# Patient Record
Sex: Female | Born: 1960
Health system: Southern US, Community
[De-identification: ages and names within clinical notes are randomized; demographics above are authoritative.]

## PROBLEM LIST (undated history)

## (undated) DIAGNOSIS — R112 Nausea with vomiting, unspecified: Secondary | ICD-10-CM

## (undated) DIAGNOSIS — T884XXA Failed or difficult intubation, initial encounter: Secondary | ICD-10-CM

## (undated) DIAGNOSIS — F419 Anxiety disorder, unspecified: Secondary | ICD-10-CM

## (undated) DIAGNOSIS — M545 Low back pain, unspecified: Secondary | ICD-10-CM

## (undated) DIAGNOSIS — M26609 Unspecified temporomandibular joint disorder, unspecified side: Secondary | ICD-10-CM

## (undated) DIAGNOSIS — G43909 Migraine, unspecified, not intractable, without status migrainosus: Secondary | ICD-10-CM

## (undated) DIAGNOSIS — I10 Essential (primary) hypertension: Secondary | ICD-10-CM

## (undated) DIAGNOSIS — Z9889 Other specified postprocedural states: Secondary | ICD-10-CM

## (undated) DIAGNOSIS — R252 Cramp and spasm: Secondary | ICD-10-CM

## (undated) DIAGNOSIS — K219 Gastro-esophageal reflux disease without esophagitis: Secondary | ICD-10-CM

## (undated) DIAGNOSIS — G8929 Other chronic pain: Secondary | ICD-10-CM

## (undated) DIAGNOSIS — E785 Hyperlipidemia, unspecified: Secondary | ICD-10-CM

## (undated) DIAGNOSIS — K449 Diaphragmatic hernia without obstruction or gangrene: Secondary | ICD-10-CM

## (undated) DIAGNOSIS — R253 Fasciculation: Principal | ICD-10-CM

## (undated) HISTORY — DX: Low back pain, unspecified: M54.50

## (undated) HISTORY — PX: CHOLECYSTECTOMY: SHX55

## (undated) HISTORY — DX: Migraine, unspecified, not intractable, without status migrainosus: G43.909

## (undated) HISTORY — PX: TONSILLECTOMY: SUR1361

## (undated) HISTORY — DX: Fasciculation: R25.3

## (undated) HISTORY — DX: Anxiety disorder, unspecified: F41.9

## (undated) HISTORY — PX: LAPAROSCOPIC HYSTERECTOMY: SHX1926

## (undated) HISTORY — DX: Diaphragmatic hernia without obstruction or gangrene: K44.9

## (undated) HISTORY — DX: Gastro-esophageal reflux disease without esophagitis: K21.9

## (undated) HISTORY — DX: Hyperlipidemia, unspecified: E78.5

## (undated) HISTORY — DX: Cramp and spasm: R25.2

## (undated) HISTORY — DX: Other chronic pain: G89.29

## (undated) HISTORY — DX: Low back pain: M54.5

---

## 1998-04-24 ENCOUNTER — Other Ambulatory Visit: Admission: RE | Admit: 1998-04-24 | Discharge: 1998-04-24 | Payer: Self-pay | Admitting: Obstetrics and Gynecology

## 2000-05-18 ENCOUNTER — Other Ambulatory Visit: Admission: RE | Admit: 2000-05-18 | Discharge: 2000-05-18 | Payer: Self-pay | Admitting: Obstetrics and Gynecology

## 2000-11-17 ENCOUNTER — Encounter: Admission: RE | Admit: 2000-11-17 | Discharge: 2000-11-17 | Payer: Self-pay | Admitting: Family Medicine

## 2000-11-17 ENCOUNTER — Encounter: Payer: Self-pay | Admitting: Family Medicine

## 2001-05-19 ENCOUNTER — Other Ambulatory Visit: Admission: RE | Admit: 2001-05-19 | Discharge: 2001-05-19 | Payer: Self-pay | Admitting: Obstetrics and Gynecology

## 2004-08-02 ENCOUNTER — Encounter: Admission: RE | Admit: 2004-08-02 | Discharge: 2004-08-02 | Payer: Self-pay | Admitting: Family Medicine

## 2004-08-10 ENCOUNTER — Encounter: Admission: RE | Admit: 2004-08-10 | Discharge: 2004-08-10 | Payer: Self-pay | Admitting: Family Medicine

## 2004-11-21 ENCOUNTER — Encounter: Admission: RE | Admit: 2004-11-21 | Discharge: 2004-11-21 | Payer: Self-pay | Admitting: Family Medicine

## 2007-11-03 ENCOUNTER — Other Ambulatory Visit: Admission: RE | Admit: 2007-11-03 | Discharge: 2007-11-03 | Payer: Self-pay | Admitting: Family Medicine

## 2008-04-18 ENCOUNTER — Emergency Department (HOSPITAL_BASED_OUTPATIENT_CLINIC_OR_DEPARTMENT_OTHER): Admission: EM | Admit: 2008-04-18 | Discharge: 2008-04-18 | Payer: Self-pay | Admitting: Emergency Medicine

## 2008-05-03 ENCOUNTER — Emergency Department (HOSPITAL_BASED_OUTPATIENT_CLINIC_OR_DEPARTMENT_OTHER): Admission: EM | Admit: 2008-05-03 | Discharge: 2008-05-03 | Payer: Self-pay | Admitting: Emergency Medicine

## 2009-02-28 ENCOUNTER — Ambulatory Visit: Payer: Self-pay | Admitting: Vascular Surgery

## 2009-02-28 ENCOUNTER — Observation Stay (HOSPITAL_COMMUNITY): Admission: EM | Admit: 2009-02-28 | Discharge: 2009-03-01 | Payer: Self-pay | Admitting: Emergency Medicine

## 2009-02-28 ENCOUNTER — Encounter (INDEPENDENT_AMBULATORY_CARE_PROVIDER_SITE_OTHER): Payer: Self-pay | Admitting: Emergency Medicine

## 2010-03-28 LAB — TROPONIN I: Troponin I: 0.01 ng/mL (ref 0.00–0.06)

## 2010-03-28 LAB — HEMOGLOBIN A1C
Hgb A1c MFr Bld: 5.8 % (ref 4.6–6.1)
Mean Plasma Glucose: 120 mg/dL

## 2010-03-28 LAB — URINALYSIS, MICROSCOPIC ONLY
Glucose, UA: NEGATIVE mg/dL
Hgb urine dipstick: NEGATIVE
Ketones, ur: NEGATIVE mg/dL
Specific Gravity, Urine: 1.027 (ref 1.005–1.030)
Urobilinogen, UA: 0.2 mg/dL (ref 0.0–1.0)
pH: 6 (ref 5.0–8.0)

## 2010-03-28 LAB — CARDIAC PANEL(CRET KIN+CKTOT+MB+TROPI)
CK, MB: 3.1 ng/mL (ref 0.3–4.0)
Relative Index: 1.7 (ref 0.0–2.5)
Relative Index: 1.7 (ref 0.0–2.5)
Total CK: 179 U/L — ABNORMAL HIGH (ref 7–177)
Total CK: 193 U/L — ABNORMAL HIGH (ref 7–177)
Troponin I: 0.01 ng/mL (ref 0.00–0.06)
Troponin I: 0.06 ng/mL (ref 0.00–0.06)

## 2010-03-28 LAB — COMPREHENSIVE METABOLIC PANEL
ALT: 30 U/L (ref 0–35)
AST: 27 U/L (ref 0–37)
Albumin: 4.2 g/dL (ref 3.5–5.2)
Alkaline Phosphatase: 92 U/L (ref 39–117)
CO2: 24 mEq/L (ref 19–32)
Creatinine, Ser: 0.75 mg/dL (ref 0.4–1.2)
Potassium: 4 mEq/L (ref 3.5–5.1)
Sodium: 139 mEq/L (ref 135–145)
Total Protein: 6.7 g/dL (ref 6.0–8.3)

## 2010-03-28 LAB — CBC
HCT: 42.1 % (ref 36.0–46.0)
Hemoglobin: 14.6 g/dL (ref 12.0–15.0)
MCV: 90.5 fL (ref 78.0–100.0)
WBC: 10 10*3/uL (ref 4.0–10.5)

## 2010-03-28 LAB — DIFFERENTIAL
Eosinophils Absolute: 0.2 10*3/uL (ref 0.0–0.7)
Eosinophils Relative: 2 % (ref 0–5)
Lymphs Abs: 2.7 10*3/uL (ref 0.7–4.0)
Monocytes Absolute: 0.8 10*3/uL (ref 0.1–1.0)

## 2010-03-28 LAB — LIPID PANEL
LDL Cholesterol: 84 mg/dL (ref 0–99)
Triglycerides: 226 mg/dL — ABNORMAL HIGH (ref <150)
VLDL: 45 mg/dL — ABNORMAL HIGH (ref 0–40)

## 2010-03-28 LAB — GLUCOSE, CAPILLARY
Glucose-Capillary: 104 mg/dL — ABNORMAL HIGH (ref 70–99)
Glucose-Capillary: 105 mg/dL — ABNORMAL HIGH (ref 70–99)

## 2010-03-28 LAB — CK TOTAL AND CKMB (NOT AT ARMC): Relative Index: 1.7 (ref 0.0–2.5)

## 2011-06-03 ENCOUNTER — Other Ambulatory Visit: Payer: Self-pay | Admitting: Obstetrics & Gynecology

## 2011-06-03 DIAGNOSIS — R928 Other abnormal and inconclusive findings on diagnostic imaging of breast: Secondary | ICD-10-CM

## 2011-06-09 ENCOUNTER — Ambulatory Visit
Admission: RE | Admit: 2011-06-09 | Discharge: 2011-06-09 | Disposition: A | Discharge status: Home / Self Care | Payer: BC Managed Care – PPO | Source: Ambulatory Visit | Attending: Obstetrics & Gynecology | Admitting: Obstetrics & Gynecology

## 2011-06-09 DIAGNOSIS — R928 Other abnormal and inconclusive findings on diagnostic imaging of breast: Secondary | ICD-10-CM

## 2012-10-03 ENCOUNTER — Other Ambulatory Visit: Payer: Self-pay | Admitting: Physician Assistant

## 2012-10-03 DIAGNOSIS — M549 Dorsalgia, unspecified: Secondary | ICD-10-CM

## 2012-10-09 ENCOUNTER — Ambulatory Visit
Admission: RE | Admit: 2012-10-09 | Discharge: 2012-10-09 | Disposition: A | Discharge status: Home / Self Care | Payer: Self-pay | Source: Ambulatory Visit | Attending: Physician Assistant | Admitting: Physician Assistant

## 2012-10-09 DIAGNOSIS — M549 Dorsalgia, unspecified: Secondary | ICD-10-CM

## 2012-11-04 ENCOUNTER — Other Ambulatory Visit: Payer: Self-pay | Admitting: Neurosurgery

## 2012-11-04 DIAGNOSIS — M542 Cervicalgia: Secondary | ICD-10-CM

## 2012-11-04 DIAGNOSIS — M545 Low back pain: Secondary | ICD-10-CM

## 2012-11-04 DIAGNOSIS — M5416 Radiculopathy, lumbar region: Secondary | ICD-10-CM | POA: Insufficient documentation

## 2012-11-04 DIAGNOSIS — M5030 Other cervical disc degeneration, unspecified cervical region: Secondary | ICD-10-CM | POA: Insufficient documentation

## 2012-11-12 ENCOUNTER — Other Ambulatory Visit: Payer: Self-pay

## 2013-03-06 ENCOUNTER — Ambulatory Visit
Admission: RE | Admit: 2013-03-06 | Discharge: 2013-03-06 | Disposition: A | Discharge status: Home / Self Care | Payer: No Typology Code available for payment source | Source: Ambulatory Visit | Attending: Physician Assistant | Admitting: Physician Assistant

## 2013-03-06 ENCOUNTER — Other Ambulatory Visit: Payer: Self-pay | Admitting: Physician Assistant

## 2013-03-06 DIAGNOSIS — R52 Pain, unspecified: Secondary | ICD-10-CM

## 2013-03-15 ENCOUNTER — Ambulatory Visit (INDEPENDENT_AMBULATORY_CARE_PROVIDER_SITE_OTHER): Payer: Self-pay

## 2013-03-15 ENCOUNTER — Encounter (INDEPENDENT_AMBULATORY_CARE_PROVIDER_SITE_OTHER): Payer: Self-pay

## 2013-03-15 ENCOUNTER — Ambulatory Visit (INDEPENDENT_AMBULATORY_CARE_PROVIDER_SITE_OTHER): Payer: No Typology Code available for payment source | Admitting: Neurology

## 2013-03-15 DIAGNOSIS — Z0289 Encounter for other administrative examinations: Secondary | ICD-10-CM

## 2013-03-15 DIAGNOSIS — M79609 Pain in unspecified limb: Secondary | ICD-10-CM

## 2013-03-15 DIAGNOSIS — R209 Unspecified disturbances of skin sensation: Secondary | ICD-10-CM

## 2013-03-15 DIAGNOSIS — IMO0001 Reserved for inherently not codable concepts without codable children: Secondary | ICD-10-CM

## 2013-03-15 NOTE — Procedures (Signed)
     HISTORY:  Diana Browning is a 53 year old patient with a least a three-year history of diffuse neuromuscular pain involving the neck, shoulders, back, legs, and arms. The patient has particular discomfort in the neck and shoulders and left arm, with associated stiffness of the neck. The patient is being evaluated for these neuromuscular symptoms.  NERVE CONDUCTION STUDIES:  Nerve conduction studies were performed on both upper extremities. The distal motor latencies and motor amplitudes for the median and ulnar nerves were within normal limits. The F wave latencies and nerve conduction velocities for these nerves were also normal. The sensory latencies for the median and ulnar nerves were normal.   EMG STUDIES:  EMG study was performed on the right upper extremity:  The first dorsal interosseous muscle reveals 2 to 4 K units with full recruitment. No fibrillations or positive waves were noted. The abductor pollicis brevis muscle reveals 2 to 4 K units with full recruitment. No fibrillations or positive waves were noted. The extensor indicis proprius muscle reveals 1 to 3 K units with full recruitment. No fibrillations or positive waves were noted. The pronator teres muscle reveals 2 to 3 K units with full recruitment. No fibrillations or positive waves were noted. The biceps muscle reveals 1 to 2 K units with full recruitment. No fibrillations or positive waves were noted. The triceps muscle reveals 2 to 4 K units with full recruitment. No fibrillations or positive waves were noted. The anterior deltoid muscle reveals 2 to 3 K units with full recruitment. No fibrillations or positive waves were noted. The cervical paraspinal muscles were tested at 2 levels. No abnormalities of insertional activity were seen at either level tested. There was good relaxation.  EMG study was performed on the left upper extremity:  The first dorsal interosseous muscle reveals 2 to 4 K units with full  recruitment. No fibrillations or positive waves were noted. The abductor pollicis brevis muscle reveals 2 to 4 K units with full recruitment. No fibrillations or positive waves were noted. The extensor indicis proprius muscle reveals 1 to 3 K units with full recruitment. No fibrillations or positive waves were noted. The pronator teres muscle reveals 2 to 3 K units with full recruitment. No fibrillations or positive waves were noted. The biceps muscle reveals 1 to 2 K units with full recruitment. No fibrillations or positive waves were noted. The triceps muscle reveals 2 to 4 K units with full recruitment. No fibrillations or positive waves were noted. The anterior deltoid muscle reveals 2 to 3 K units with full recruitment. No fibrillations or positive waves were noted. The cervical paraspinal muscles were tested at 2 levels. No abnormalities of insertional activity were seen at either level tested. There was good relaxation.   IMPRESSION:  Nerve conduction studies done on both upper extremities were within normal limits. No evidence of a neuropathy is seen. EMG evaluation of both upper extremities is unremarkable, without evidence of an overlying cervical radiculopathy on either side.  Jill Alexanders MD 03/15/2013 10:49 AM  [facility] [address] [place], [place] [address]  Phone [phone] Fax [phone]

## 2013-03-27 ENCOUNTER — Ambulatory Visit: Payer: No Typology Code available for payment source | Attending: Orthopedic Surgery

## 2013-03-27 DIAGNOSIS — R293 Abnormal posture: Secondary | ICD-10-CM | POA: Insufficient documentation

## 2013-03-27 DIAGNOSIS — R5381 Other malaise: Secondary | ICD-10-CM | POA: Insufficient documentation

## 2013-03-27 DIAGNOSIS — M542 Cervicalgia: Secondary | ICD-10-CM | POA: Insufficient documentation

## 2013-03-27 DIAGNOSIS — IMO0001 Reserved for inherently not codable concepts without codable children: Secondary | ICD-10-CM | POA: Insufficient documentation

## 2013-03-30 ENCOUNTER — Ambulatory Visit: Payer: No Typology Code available for payment source | Admitting: Physical Therapy

## 2013-04-04 ENCOUNTER — Encounter: Payer: No Typology Code available for payment source | Admitting: Physical Therapy

## 2013-04-05 ENCOUNTER — Telehealth: Payer: Self-pay | Admitting: *Deleted

## 2013-04-05 NOTE — Telephone Encounter (Signed)
Pt called to cancel her appts for 04/06/13 due to muscle spasm. She stated she needed one more day off...td

## 2013-04-06 ENCOUNTER — Encounter: Payer: No Typology Code available for payment source | Admitting: Physical Therapy

## 2013-04-10 ENCOUNTER — Ambulatory Visit: Payer: No Typology Code available for payment source | Attending: Orthopedic Surgery

## 2013-04-10 DIAGNOSIS — M542 Cervicalgia: Secondary | ICD-10-CM | POA: Insufficient documentation

## 2013-04-10 DIAGNOSIS — IMO0001 Reserved for inherently not codable concepts without codable children: Secondary | ICD-10-CM | POA: Insufficient documentation

## 2013-04-10 DIAGNOSIS — R5381 Other malaise: Secondary | ICD-10-CM | POA: Insufficient documentation

## 2013-04-10 DIAGNOSIS — R293 Abnormal posture: Secondary | ICD-10-CM | POA: Insufficient documentation

## 2013-04-12 ENCOUNTER — Ambulatory Visit: Payer: No Typology Code available for payment source | Admitting: Physical Therapy

## 2013-04-19 ENCOUNTER — Encounter: Payer: Self-pay | Admitting: Neurology

## 2013-04-20 ENCOUNTER — Encounter (INDEPENDENT_AMBULATORY_CARE_PROVIDER_SITE_OTHER): Payer: Self-pay

## 2013-04-20 ENCOUNTER — Encounter: Payer: Self-pay | Admitting: Neurology

## 2013-04-20 ENCOUNTER — Ambulatory Visit (INDEPENDENT_AMBULATORY_CARE_PROVIDER_SITE_OTHER): Payer: No Typology Code available for payment source | Admitting: Neurology

## 2013-04-20 VITALS — BP 146/90 | HR 83 | Ht 66.0 in | Wt 166.0 lb

## 2013-04-20 DIAGNOSIS — R259 Unspecified abnormal involuntary movements: Secondary | ICD-10-CM

## 2013-04-20 DIAGNOSIS — R252 Cramp and spasm: Secondary | ICD-10-CM

## 2013-04-20 DIAGNOSIS — R253 Fasciculation: Secondary | ICD-10-CM

## 2013-04-20 HISTORY — DX: Cramp and spasm: R25.2

## 2013-04-20 HISTORY — DX: Fasciculation: R25.3

## 2013-04-20 MED ORDER — GABAPENTIN 300 MG PO CAPS: ORAL_CAPSULE | ORAL | Status: DC

## 2013-04-20 NOTE — Patient Instructions (Signed)
Leg Cramps  Leg cramps that occur during exercise can be caused by poor circulation or dehydration. However, muscle cramps that occur at rest or during the night are usually not due to any serious medical problem. Heat cramps may cause muscle spasms during hot weather.   CAUSES  There is no clear cause for muscle cramps. However, dehydration may be a factor for those who do not drink enough fluids and those who exercise in the heat. Imbalances in the level of sodium, potassium, calcium or magnesium in the muscle tissue may also be a factor. Some medications, such as water pills (diuretics), may cause loss of chemicals that the body needs (like sodium and potassium) and cause muscle cramps.  TREATMENT   · Make sure your diet has enough fluids and essential minerals for the muscle to work normally.  · Avoid strenuous exercise for several days if you have been having frequent leg cramps.  · Stretch and massage the cramped muscle for several minutes.  · Some medicines may be helpful in some patients with night cramps. Only take over-the-counter or prescription medicines as directed by your caregiver.  SEEK IMMEDIATE MEDICAL CARE IF:   · Your leg cramps become worse.  · Your foot becomes cold, numb, or blue.  Document Released: 01/30/2004 Document Revised: 03/16/2011 Document Reviewed: 01/17/2008  Exit[employer]® Patient Information ©2014 [employer].

## 2013-04-20 NOTE — Progress Notes (Signed)
Reason for visit: Fasciculations  Diana Browning is a 53 y.o. female  History of present illness:  Diana Browning is a 53 year old left-handed white female with a history of fasciculations in the muscles that began on the left arm and left leg 5 years ago. Eventually, the same issues spread to the right arm and leg. The patient has chronic low back pain in part associated from multiple injuries that were occurred while she was actively riding horses. The patient also has some neck discomfort. The patient reports no weakness of the extremities or problems with control of the bowels or the bladder. The patient denies any balance issues. The patient has had no other issues such as visual changes, speech or swallowing problems. The patient indicates that she also has muscle cramps that involves the arms and legs as well, more likely to occur in the legs. When she was on statin medications for cholesterol, the cramps were quite significant, but she has come off of this medication. The patient was recently placed on gabapentin, currently on 200 mg 3 times daily with some good benefit with the cramps and the fasciculations. The patient has had EMG and nerve conduction studies done through this office that were unremarkable, without evidence of a peripheral neuropathy or evidence of a radiculopathy or a myopathic disorder. The patient does report some intermittent numbness of the hands and forearms, occasionally with the feet. The numbness is not persistent.  Past Medical History  Diagnosis Date  . Migraine   . Chronic low back pain   . Dyslipidemia   . GERD (gastroesophageal reflux disease)   . Hiatal hernia   . Anxiety   . Fasciculations of muscle 04/20/2013  . Muscle cramping 04/20/2013    Past Surgical History  Procedure Laterality Date  . Laparoscopic hysterectomy    . Tonsillectomy    . Cholecystectomy      Family History  Problem Relation Age of Onset  . Stroke Father   . COPD Sister      Social history:  reports that she has quit smoking. She has never used smokeless tobacco. She reports that she does not drink alcohol or use illicit drugs.  Medications:  Current Outpatient Prescriptions on File Prior to Visit  Medication Sig Dispense Refill  . ALPRAZolam (XANAX) 0.5 MG tablet Take 0.5 mg by mouth at bedtime.       Marland Kitchen estradiol (ESTRACE) 2 MG tablet Take 2 mg by mouth 2 (two) times daily.       Marland Kitchen HYDROcodone-acetaminophen (NORCO/VICODIN) 5-325 MG per tablet Take 1 tablet by mouth daily.      . methocarbamol (ROBAXIN) 750 MG tablet Take 750 mg by mouth daily as needed.       No current facility-administered medications on file prior to visit.      Allergies  Allergen Reactions  . Penicillins     Unknown reaction    ROS:  Out of a complete 14 system review of symptoms, the patient complains only of the following symptoms, and all other reviewed systems are negative.  Excessive sweating Neck pain, neck stiffness Insomnia Joint pain, back pain, achy muscles, muscle cramps Numbness Decreased concentration, depression  Blood pressure 146/90, pulse 83, height 5\' 6"  (1.676 m), weight 166 lb (75.297 kg).  Physical Exam  General: The patient is alert and cooperative at the time of the examination.  Eyes: Pupils are equal, round, and reactive to light. Discs are flat bilaterally.  Neck: The neck is supple,  no carotid bruits are noted.  Respiratory: The respiratory examination is clear.  Cardiovascular: The cardiovascular examination reveals a regular rate and rhythm, no obvious murmurs or rubs are noted.  Neuromuscular: Range of movement of the cervical spine lacks about 10 of full lateral rotation. The patient has good range of movement of the low back.  Skin: Extremities are without significant edema.  Neurologic Exam  Mental status: The patient is alert and oriented x 3 at the time of the examination. The patient has apparent normal recent and remote  memory, with an apparently normal attention span and concentration ability.  Cranial nerves: Facial symmetry is present. There is good sensation of the face to pinprick and soft touch bilaterally. The strength of the facial muscles and the muscles to head turning and shoulder shrug are normal bilaterally. Speech is well enunciated, no aphasia or dysarthria is noted. Extraocular movements are full. Visual fields are full. The tongue is midline, and the patient has symmetric elevation of the soft palate. No obvious hearing deficits are noted.  Motor: The motor testing reveals 5 over 5 strength of all 4 extremities. Good symmetric motor tone is noted throughout. No fasciculations were observed in the muscles. No muscular atrophy is seen.  Sensory: Sensory testing is intact to pinprick, soft touch, vibration sensation, and position sense on all 4 extremities. No evidence of extinction is noted.  Coordination: Cerebellar testing reveals good finger-nose-finger and heel-to-shin bilaterally.  Gait and station: Gait is normal. Tandem gait is normal. Romberg is negative. No drift is seen.  Reflexes: Deep tendon reflexes are symmetric and normal bilaterally, with the exception that the ankle jerk reflexes are depressed bilaterally. Toes are downgoing bilaterally.   Assessment/Plan:  1. Probable fasciculation syndrome  The patient has no observable fasciculations, but she reports muscle twitches throughout the body, with intermittent cramping of the muscles. The patient has gained some benefit from gabapentin. Dilantin may also offer benefit. The patient will be increased on the gabapentin taking 300 mg 3 times daily for one or 2 weeks, and then go to 300 mg twice during the day, 600 mg in the evening. The patient will followup in 6 months.  Jill Alexanders MD 04/20/2013 9:18 PM  [facility] [address] [place], [place] [address]  Phone [phone] Fax  [phone]

## 2013-06-28 ENCOUNTER — Other Ambulatory Visit: Payer: Self-pay | Admitting: Orthopedic Surgery

## 2013-06-28 DIAGNOSIS — M25511 Pain in right shoulder: Secondary | ICD-10-CM

## 2013-07-05 ENCOUNTER — Ambulatory Visit
Admission: RE | Admit: 2013-07-05 | Discharge: 2013-07-05 | Disposition: A | Discharge status: Home / Self Care | Payer: No Typology Code available for payment source | Source: Ambulatory Visit | Attending: Orthopedic Surgery | Admitting: Orthopedic Surgery

## 2013-07-05 DIAGNOSIS — M25511 Pain in right shoulder: Secondary | ICD-10-CM

## 2014-07-03 ENCOUNTER — Encounter (HOSPITAL_COMMUNITY): Payer: Self-pay | Admitting: *Deleted

## 2014-07-03 ENCOUNTER — Emergency Department (HOSPITAL_COMMUNITY): Payer: 59

## 2014-07-03 ENCOUNTER — Emergency Department (HOSPITAL_COMMUNITY)
Admission: EM | Admit: 2014-07-03 | Discharge: 2014-07-03 | Disposition: A | Discharge status: Home / Self Care | Payer: 59 | Attending: Emergency Medicine | Admitting: Emergency Medicine

## 2014-07-03 DIAGNOSIS — F419 Anxiety disorder, unspecified: Secondary | ICD-10-CM | POA: Diagnosis not present

## 2014-07-03 DIAGNOSIS — Z79899 Other long term (current) drug therapy: Secondary | ICD-10-CM | POA: Diagnosis not present

## 2014-07-03 DIAGNOSIS — G43909 Migraine, unspecified, not intractable, without status migrainosus: Secondary | ICD-10-CM | POA: Diagnosis not present

## 2014-07-03 DIAGNOSIS — Z87891 Personal history of nicotine dependence: Secondary | ICD-10-CM | POA: Diagnosis not present

## 2014-07-03 DIAGNOSIS — G8929 Other chronic pain: Secondary | ICD-10-CM | POA: Diagnosis not present

## 2014-07-03 DIAGNOSIS — R52 Pain, unspecified: Secondary | ICD-10-CM

## 2014-07-03 DIAGNOSIS — Z8639 Personal history of other endocrine, nutritional and metabolic disease: Secondary | ICD-10-CM | POA: Insufficient documentation

## 2014-07-03 DIAGNOSIS — Z8719 Personal history of other diseases of the digestive system: Secondary | ICD-10-CM | POA: Insufficient documentation

## 2014-07-03 DIAGNOSIS — R51 Headache: Secondary | ICD-10-CM | POA: Diagnosis present

## 2014-07-03 DIAGNOSIS — Z880 Allergy status to penicillin: Secondary | ICD-10-CM | POA: Insufficient documentation

## 2014-07-03 DIAGNOSIS — R519 Headache, unspecified: Secondary | ICD-10-CM

## 2014-07-03 LAB — CBC WITH DIFFERENTIAL/PLATELET
BASOS PCT: 1 % (ref 0–1)
Basophils Absolute: 0 10*3/uL (ref 0.0–0.1)
EOS ABS: 0.1 10*3/uL (ref 0.0–0.7)
EOS PCT: 2 % (ref 0–5)
HCT: 37.4 % (ref 36.0–46.0)
Hemoglobin: 12.9 g/dL (ref 12.0–15.0)
LYMPHS ABS: 2.5 10*3/uL (ref 0.7–4.0)
Lymphocytes Relative: 32 % (ref 12–46)
MCH: 30.6 pg (ref 26.0–34.0)
MCHC: 34.5 g/dL (ref 30.0–36.0)
MCV: 88.8 fL (ref 78.0–100.0)
Monocytes Absolute: 0.5 10*3/uL (ref 0.1–1.0)
Monocytes Relative: 7 % (ref 3–12)
NEUTROS PCT: 58 % (ref 43–77)
Neutro Abs: 4.6 10*3/uL (ref 1.7–7.7)
PLATELETS: 261 10*3/uL (ref 150–400)
RBC: 4.21 MIL/uL (ref 3.87–5.11)
RDW: 13 % (ref 11.5–15.5)
WBC: 7.7 10*3/uL (ref 4.0–10.5)

## 2014-07-03 LAB — COMPREHENSIVE METABOLIC PANEL
ALBUMIN: 3.4 g/dL — AB (ref 3.5–5.0)
ALT: 16 U/L (ref 14–54)
AST: 18 U/L (ref 15–41)
Alkaline Phosphatase: 50 U/L (ref 38–126)
Anion gap: 5 (ref 5–15)
BILIRUBIN TOTAL: 0.3 mg/dL (ref 0.3–1.2)
BUN: 11 mg/dL (ref 6–20)
CO2: 26 mmol/L (ref 22–32)
CREATININE: 0.79 mg/dL (ref 0.44–1.00)
Calcium: 8.1 mg/dL — ABNORMAL LOW (ref 8.9–10.3)
Chloride: 109 mmol/L (ref 101–111)
GFR calc Af Amer: >60 mL/min (ref >60)
GFR calc non Af Amer: >60 mL/min (ref >60)
Glucose, Bld: 135 mg/dL — ABNORMAL HIGH (ref 65–99)
POTASSIUM: 3.5 mmol/L (ref 3.5–5.1)
Sodium: 140 mmol/L (ref 135–145)
TOTAL PROTEIN: 6 g/dL — AB (ref 6.5–8.1)

## 2014-07-03 MED ORDER — DIPHENHYDRAMINE HCL 50 MG/ML IJ SOLN
25.0000 mg | Freq: Once | INTRAMUSCULAR | Status: AC
  Administered 2014-07-03: 25 mg via INTRAVENOUS
  Filled 2014-07-03: qty 1

## 2014-07-03 MED ORDER — OXYCODONE-ACETAMINOPHEN 5-325 MG PO TABS: 1.0000 | ORAL_TABLET | Freq: Four times a day (QID) | ORAL | Status: DC | PRN

## 2014-07-03 MED ORDER — OXYCODONE-ACETAMINOPHEN 5-325 MG PO TABS
ORAL_TABLET | ORAL | Status: AC
  Filled 2014-07-03: qty 1

## 2014-07-03 MED ORDER — METOCLOPRAMIDE HCL 5 MG/ML IJ SOLN
10.0000 mg | Freq: Once | INTRAMUSCULAR | Status: AC
  Administered 2014-07-03: 10 mg via INTRAVENOUS
  Filled 2014-07-03: qty 2

## 2014-07-03 MED ORDER — KETOROLAC TROMETHAMINE 30 MG/ML IJ SOLN
30.0000 mg | Freq: Once | INTRAMUSCULAR | Status: AC
  Administered 2014-07-03: 30 mg via INTRAVENOUS
  Filled 2014-07-03: qty 1

## 2014-07-03 MED ORDER — TRAMADOL HCL 50 MG PO TABS: 50.0000 mg | ORAL_TABLET | Freq: Four times a day (QID) | ORAL | Status: DC | PRN

## 2014-07-03 MED ORDER — OXYCODONE-ACETAMINOPHEN 5-325 MG PO TABS
1.0000 | ORAL_TABLET | Freq: Once | ORAL | Status: AC
  Administered 2014-07-03: 1 via ORAL

## 2014-07-03 MED ORDER — SODIUM CHLORIDE 0.9 % IV BOLUS (SEPSIS)
500.0000 mL | Freq: Once | INTRAVENOUS | Status: AC
  Administered 2014-07-03: 500 mL via INTRAVENOUS

## 2014-07-03 NOTE — Discharge Instructions (Signed)
Follow up with your md next week for recheck °

## 2014-07-03 NOTE — ED Notes (Signed)
Pts family member given something to eat.

## 2014-07-03 NOTE — ED Notes (Signed)
Pt given sandwich and crackers.  

## 2014-07-03 NOTE — ED Notes (Signed)
Pt reports sudden onset right side headache starting around 1130 today. Pt reports seeing a prism. Pt went to [facility] on [address]. Pt was found to be hypertensive, pt given amlodipine x 2 then instructed to come to ED for further evaluation. Pt was not given any pain medication, pt reports a decrease in headache. Pt reports taking an aspirin around 1200 at work.

## 2014-07-06 NOTE — ED Provider Notes (Signed)
CSN: [id number]     Arrival date & time 07/03/14  1451 History   First MD Initiated Contact with Patient 07/03/14 1723     Chief Complaint  Patient presents with  . Headache     (Consider location/radiation/quality/duration/timing/severity/associated sxs/prior Treatment) Patient is a 54 y.o. female presenting with headaches. The history is provided by the patient (pt comlains of a headache and some flashing lights.  this is worse then her nl headache).  Headache Pain location:  Generalized Quality:  Dull Radiates to:  Eyes Severity currently:  6/10 Severity at highest:  8/10 Onset quality:  Sudden Associated symptoms: no abdominal pain, no back pain, no congestion, no cough, no diarrhea, no fatigue, no seizures and no sinus pressure     Past Medical History  Diagnosis Date  . Migraine   . Chronic low back pain   . Dyslipidemia   . GERD (gastroesophageal reflux disease)   . Hiatal hernia   . Anxiety   . Fasciculations of muscle 04/20/2013  . Muscle cramping 04/20/2013   Past Surgical History  Procedure Laterality Date  . Laparoscopic hysterectomy    . Tonsillectomy    . Cholecystectomy     Family History  Problem Relation Age of Onset  . Stroke Father   . COPD Sister    History  Substance Use Topics  . Smoking status: Former Research scientist (life sciences)  . Smokeless tobacco: Never Used  . Alcohol Use: No     Comment: Quit 2011   OB History    No data available     Review of Systems  Constitutional: Negative for appetite change and fatigue.  HENT: Negative for congestion, ear discharge and sinus pressure.   Eyes: Negative for discharge.  Respiratory: Negative for cough.   Cardiovascular: Negative for chest pain.  Gastrointestinal: Negative for abdominal pain and diarrhea.  Genitourinary: Negative for frequency and hematuria.  Musculoskeletal: Negative for back pain.  Skin: Negative for rash.  Neurological: Positive for headaches. Negative for seizures.  Psychiatric/Behavioral:  Negative for hallucinations.      Allergies  Penicillins  Home Medications   Prior to Admission medications   Medication Sig Start Date End Date Taking? Authorizing Provider  acetaminophen (TYLENOL) 500 MG tablet Take 500 mg by mouth every 6 (six) hours as needed.    Historical Provider, MD  ALPRAZolam Duanne Moron) 0.5 MG tablet Take 0.5 mg by mouth at bedtime.  02/14/13   Historical Provider, MD  estradiol (ESTRACE) 2 MG tablet Take 2 mg by mouth 2 (two) times daily.  03/13/13   Historical Provider, MD  gabapentin (NEURONTIN) 300 MG capsule One capsule in the morning and at midday, two capsules in the evening 04/20/13   Kathrynn Ducking, MD  HYDROcodone-acetaminophen (NORCO/VICODIN) 5-325 MG per tablet Take 1 tablet by mouth daily. 03/01/13   Historical Provider, MD  Magnesium Oxide 500 MG CAPS Take 500 mg by mouth daily.    Historical Provider, MD  methocarbamol (ROBAXIN) 750 MG tablet Take 750 mg by mouth daily as needed. 03/08/13   Historical Provider, MD  oxyCODONE-acetaminophen (PERCOCET) 5-325 MG per tablet Take 1 tablet by mouth every 6 (six) hours as needed. 07/03/14   Milton Ferguson, MD  traMADol (ULTRAM) 50 MG tablet Take 1 tablet (50 mg total) by mouth every 6 (six) hours as needed. 07/03/14   Milton Ferguson, MD   BP 142/75 mmHg  Pulse 60  Temp(Src) 97.9 F (36.6 C) (Oral)  Resp 15  SpO2 99% Physical Exam  Constitutional: She is  oriented to person, place, and time. She appears well-developed.  HENT:  Head: Normocephalic.  Eyes: Conjunctivae and EOM are normal. No scleral icterus.  Neck: Neck supple. No thyromegaly present.  Cardiovascular: Normal rate and regular rhythm.  Exam reveals no gallop and no friction rub.   No murmur heard. Pulmonary/Chest: No stridor. She has no wheezes. She has no rales. She exhibits no tenderness.  Abdominal: She exhibits no distension. There is no tenderness. There is no rebound.  Musculoskeletal: Normal range of motion. She exhibits no edema.   Lymphadenopathy:    She has no cervical adenopathy.  Neurological: She is oriented to person, place, and time. She exhibits normal muscle tone. Coordination normal.  Skin: No rash noted. No erythema.  Psychiatric: She has a normal mood and affect. Her behavior is normal.    ED Course  Procedures (including critical care time) Labs Review Labs Reviewed  COMPREHENSIVE METABOLIC PANEL - Abnormal; Notable for the following:    Glucose, Bld 135 (*)    Calcium 8.1 (*)    Total Protein 6.0 (*)    Albumin 3.4 (*)    All other components within normal limits  CBC WITH DIFFERENTIAL/PLATELET    Imaging Review No results found.   EKG Interpretation None      MDM   Final diagnoses:  Pain  Headache around the eyes    Headache,  Pt improved with tx.  Nl ct head,  Nl labs,   Supple neck,   Headache consistent with migraine,  No meningismus signs.  She is improved with tx and will follow up with pcp    Milton Ferguson, MD 07/06/14 1429

## 2014-07-17 ENCOUNTER — Other Ambulatory Visit: Payer: Self-pay | Admitting: Orthopedic Surgery

## 2014-07-17 DIAGNOSIS — M542 Cervicalgia: Secondary | ICD-10-CM

## 2014-07-28 ENCOUNTER — Other Ambulatory Visit: Payer: No Typology Code available for payment source

## 2014-08-02 ENCOUNTER — Ambulatory Visit
Admission: RE | Admit: 2014-08-02 | Discharge: 2014-08-02 | Disposition: A | Discharge status: Home / Self Care | Payer: 59 | Source: Ambulatory Visit | Attending: Orthopedic Surgery | Admitting: Orthopedic Surgery

## 2014-08-02 DIAGNOSIS — M542 Cervicalgia: Secondary | ICD-10-CM

## 2014-10-17 ENCOUNTER — Other Ambulatory Visit: Payer: Self-pay | Admitting: Orthopedic Surgery

## 2014-10-17 DIAGNOSIS — M25561 Pain in right knee: Secondary | ICD-10-CM

## 2014-10-29 ENCOUNTER — Ambulatory Visit
Admission: RE | Admit: 2014-10-29 | Discharge: 2014-10-29 | Disposition: A | Discharge status: Home / Self Care | Payer: 59 | Source: Ambulatory Visit | Attending: Orthopedic Surgery | Admitting: Orthopedic Surgery

## 2014-10-29 DIAGNOSIS — M25561 Pain in right knee: Secondary | ICD-10-CM

## 2015-04-04 ENCOUNTER — Other Ambulatory Visit: Payer: Self-pay | Admitting: Gastroenterology

## 2015-04-04 DIAGNOSIS — R1310 Dysphagia, unspecified: Secondary | ICD-10-CM

## 2015-04-08 ENCOUNTER — Ambulatory Visit
Admission: RE | Admit: 2015-04-08 | Discharge: 2015-04-08 | Disposition: A | Discharge status: Home / Self Care | Payer: BLUE CROSS/BLUE SHIELD | Source: Ambulatory Visit | Attending: Gastroenterology | Admitting: Gastroenterology

## 2015-04-08 DIAGNOSIS — R1310 Dysphagia, unspecified: Secondary | ICD-10-CM

## 2015-07-26 ENCOUNTER — Other Ambulatory Visit: Payer: Self-pay | Admitting: Neurosurgery

## 2015-07-26 DIAGNOSIS — M5412 Radiculopathy, cervical region: Secondary | ICD-10-CM

## 2015-07-30 ENCOUNTER — Ambulatory Visit
Admission: RE | Admit: 2015-07-30 | Discharge: 2015-07-30 | Disposition: A | Discharge status: Home / Self Care | Payer: BLUE CROSS/BLUE SHIELD | Source: Ambulatory Visit | Attending: Neurosurgery | Admitting: Neurosurgery

## 2015-07-30 DIAGNOSIS — M5412 Radiculopathy, cervical region: Secondary | ICD-10-CM

## 2015-08-06 ENCOUNTER — Other Ambulatory Visit: Payer: BLUE CROSS/BLUE SHIELD

## 2015-09-12 ENCOUNTER — Other Ambulatory Visit: Payer: Self-pay | Admitting: Orthopedic Surgery

## 2015-09-12 DIAGNOSIS — M25512 Pain in left shoulder: Secondary | ICD-10-CM

## 2015-09-21 ENCOUNTER — Ambulatory Visit
Admission: RE | Admit: 2015-09-21 | Discharge: 2015-09-21 | Disposition: A | Discharge status: Home / Self Care | Payer: BLUE CROSS/BLUE SHIELD | Source: Ambulatory Visit | Attending: Orthopedic Surgery | Admitting: Orthopedic Surgery

## 2015-09-21 DIAGNOSIS — M25512 Pain in left shoulder: Secondary | ICD-10-CM

## 2015-10-31 ENCOUNTER — Ambulatory Visit: Payer: Self-pay | Admitting: Physician Assistant

## 2015-10-31 ENCOUNTER — Encounter (HOSPITAL_BASED_OUTPATIENT_CLINIC_OR_DEPARTMENT_OTHER): Payer: Self-pay | Admitting: *Deleted

## 2015-10-31 NOTE — H&P (Signed)
Diana Browning is an 55 y.o. female.   Chief Complaint: left shoulder rotator cuff tear  HPI: Diana Browning continues to struggle with severe left-sided shoulder pain that radiates up into the neck and then down the upper arm.  Injection back in August was only minimally helpful.  She went back to see Dr. Sherwood Gambler who did her neck surgery and he repeated cervical MRI according to the patient and she was told that everything was fine and there was no new nerve root impingement.  We know that she had rotator cuff disease on her right side and underwent arthroscopy and subacromial decompression which was helpful. Left shoulder MRI showed  Partial rotator cuff tear  Past Medical History:  Diagnosis Date  . Anxiety   . Chronic low back pain   . Difficult intubation    pt states that she has TMJ and it is difficult to open her mouth wide.  . Dyslipidemia   . Fasciculations of muscle 04/20/2013  . GERD (gastroesophageal reflux disease)   . Hiatal hernia   . Hypertension   . Migraine   . Muscle cramping 04/20/2013  . PONV (postoperative nausea and vomiting)   . TMJ dysfunction     Past Surgical History:  Procedure Laterality Date  . CHOLECYSTECTOMY    . LAPAROSCOPIC HYSTERECTOMY    . TONSILLECTOMY      Family History  Problem Relation Age of Onset  . Stroke Father   . COPD Sister    Social History:  reports that she has quit smoking. She has never used smokeless tobacco. She reports that she does not drink alcohol or use drugs.  Allergies:  Allergies  Allergen Reactions  . Penicillins     Unknown reaction     (Not in a hospital admission)  No results found for this or any previous visit (from the past 48 hour(s)). No results found.  Review of Systems  Musculoskeletal: Positive for joint pain.  Psychiatric/Behavioral: Positive for depression. The patient is nervous/anxious.   All other systems reviewed and are negative.   There were no vitals taken for this visit. Physical Exam   Constitutional: She is oriented to person, place, and time. She appears well-developed and well-nourished. No distress.  HENT:  Head: Normocephalic and atraumatic.  Nose: Nose normal.  Eyes: Conjunctivae and EOM are normal. Pupils are equal, round, and reactive to light.  Neck: Normal range of motion. Neck supple.  Cardiovascular: Normal rate and intact distal pulses.   Respiratory: Effort normal. No respiratory distress.  GI: Soft. She exhibits no distension. There is no tenderness.  Musculoskeletal:       Left shoulder: She exhibits decreased range of motion, tenderness, pain and decreased strength.  Neurological: She is alert and oriented to person, place, and time.  Skin: Skin is warm and dry. No rash noted. No erythema.  Psychiatric: She has a normal mood and affect. Her behavior is normal.     Assessment/Plan Left shoulder rotator cuff tear  Candidate for arthroscopy, exam under anesthesia, debridement.  Would lean against distal clavicle hopefully not needing a repair, but she does have an objective partial tear.  Risks and benefits discussed in detail.  Proceed on with scheduling for the left shoulder.  Chriss Czar, PA-C 10/31/2015, 2:05 PM

## 2015-11-04 ENCOUNTER — Other Ambulatory Visit: Payer: Self-pay

## 2015-11-04 ENCOUNTER — Encounter (HOSPITAL_BASED_OUTPATIENT_CLINIC_OR_DEPARTMENT_OTHER)
Admission: RE | Admit: 2015-11-04 | Discharge: 2015-11-04 | Disposition: A | Discharge status: Home / Self Care | Payer: BLUE CROSS/BLUE SHIELD | Source: Ambulatory Visit | Attending: Orthopedic Surgery | Admitting: Orthopedic Surgery

## 2015-11-04 DIAGNOSIS — Z825 Family history of asthma and other chronic lower respiratory diseases: Secondary | ICD-10-CM | POA: Diagnosis not present

## 2015-11-04 DIAGNOSIS — Z880 Allergy status to penicillin: Secondary | ICD-10-CM | POA: Diagnosis not present

## 2015-11-04 DIAGNOSIS — M19012 Primary osteoarthritis, left shoulder: Secondary | ICD-10-CM | POA: Diagnosis not present

## 2015-11-04 DIAGNOSIS — G8929 Other chronic pain: Secondary | ICD-10-CM | POA: Diagnosis not present

## 2015-11-04 DIAGNOSIS — I10 Essential (primary) hypertension: Secondary | ICD-10-CM | POA: Diagnosis not present

## 2015-11-04 DIAGNOSIS — M26609 Unspecified temporomandibular joint disorder, unspecified side: Secondary | ICD-10-CM | POA: Diagnosis not present

## 2015-11-04 DIAGNOSIS — M545 Low back pain: Secondary | ICD-10-CM | POA: Diagnosis not present

## 2015-11-04 DIAGNOSIS — M75112 Incomplete rotator cuff tear or rupture of left shoulder, not specified as traumatic: Secondary | ICD-10-CM | POA: Diagnosis present

## 2015-11-04 DIAGNOSIS — Z87891 Personal history of nicotine dependence: Secondary | ICD-10-CM | POA: Diagnosis not present

## 2015-11-04 DIAGNOSIS — K219 Gastro-esophageal reflux disease without esophagitis: Secondary | ICD-10-CM | POA: Diagnosis not present

## 2015-11-04 DIAGNOSIS — Z90710 Acquired absence of both cervix and uterus: Secondary | ICD-10-CM | POA: Diagnosis not present

## 2015-11-04 DIAGNOSIS — E785 Hyperlipidemia, unspecified: Secondary | ICD-10-CM | POA: Diagnosis not present

## 2015-11-04 DIAGNOSIS — K449 Diaphragmatic hernia without obstruction or gangrene: Secondary | ICD-10-CM | POA: Diagnosis not present

## 2015-11-04 DIAGNOSIS — Z79899 Other long term (current) drug therapy: Secondary | ICD-10-CM | POA: Diagnosis not present

## 2015-11-04 DIAGNOSIS — F419 Anxiety disorder, unspecified: Secondary | ICD-10-CM | POA: Diagnosis not present

## 2015-11-04 DIAGNOSIS — Z9889 Other specified postprocedural states: Secondary | ICD-10-CM | POA: Diagnosis not present

## 2015-11-04 DIAGNOSIS — X58XXXA Exposure to other specified factors, initial encounter: Secondary | ICD-10-CM | POA: Diagnosis not present

## 2015-11-04 DIAGNOSIS — M7542 Impingement syndrome of left shoulder: Secondary | ICD-10-CM | POA: Diagnosis not present

## 2015-11-04 DIAGNOSIS — Z823 Family history of stroke: Secondary | ICD-10-CM | POA: Diagnosis not present

## 2015-11-04 DIAGNOSIS — Z9049 Acquired absence of other specified parts of digestive tract: Secondary | ICD-10-CM | POA: Diagnosis not present

## 2015-11-04 DIAGNOSIS — S43432A Superior glenoid labrum lesion of left shoulder, initial encounter: Secondary | ICD-10-CM | POA: Diagnosis not present

## 2015-11-04 DIAGNOSIS — Z79890 Hormone replacement therapy (postmenopausal): Secondary | ICD-10-CM | POA: Diagnosis not present

## 2015-11-06 ENCOUNTER — Encounter (HOSPITAL_BASED_OUTPATIENT_CLINIC_OR_DEPARTMENT_OTHER)
Admission: RE | Disposition: A | Discharge status: Home / Self Care | Payer: Self-pay | Source: Ambulatory Visit | Attending: Orthopedic Surgery

## 2015-11-06 ENCOUNTER — Ambulatory Visit (HOSPITAL_BASED_OUTPATIENT_CLINIC_OR_DEPARTMENT_OTHER): Payer: BLUE CROSS/BLUE SHIELD | Admitting: Certified Registered"

## 2015-11-06 ENCOUNTER — Ambulatory Visit (HOSPITAL_BASED_OUTPATIENT_CLINIC_OR_DEPARTMENT_OTHER)
Admission: RE | Admit: 2015-11-06 | Discharge: 2015-11-07 | Disposition: A | Discharge status: Home / Self Care | Payer: BLUE CROSS/BLUE SHIELD | Source: Ambulatory Visit | Attending: Orthopedic Surgery | Admitting: Orthopedic Surgery

## 2015-11-06 ENCOUNTER — Encounter (HOSPITAL_BASED_OUTPATIENT_CLINIC_OR_DEPARTMENT_OTHER): Payer: Self-pay | Admitting: Certified Registered"

## 2015-11-06 DIAGNOSIS — M75112 Incomplete rotator cuff tear or rupture of left shoulder, not specified as traumatic: Secondary | ICD-10-CM | POA: Diagnosis not present

## 2015-11-06 DIAGNOSIS — Z9049 Acquired absence of other specified parts of digestive tract: Secondary | ICD-10-CM | POA: Insufficient documentation

## 2015-11-06 DIAGNOSIS — Z825 Family history of asthma and other chronic lower respiratory diseases: Secondary | ICD-10-CM | POA: Insufficient documentation

## 2015-11-06 DIAGNOSIS — Z79899 Other long term (current) drug therapy: Secondary | ICD-10-CM | POA: Insufficient documentation

## 2015-11-06 DIAGNOSIS — K449 Diaphragmatic hernia without obstruction or gangrene: Secondary | ICD-10-CM | POA: Insufficient documentation

## 2015-11-06 DIAGNOSIS — M26609 Unspecified temporomandibular joint disorder, unspecified side: Secondary | ICD-10-CM | POA: Insufficient documentation

## 2015-11-06 DIAGNOSIS — Z87891 Personal history of nicotine dependence: Secondary | ICD-10-CM | POA: Insufficient documentation

## 2015-11-06 DIAGNOSIS — K219 Gastro-esophageal reflux disease without esophagitis: Secondary | ICD-10-CM | POA: Insufficient documentation

## 2015-11-06 DIAGNOSIS — F419 Anxiety disorder, unspecified: Secondary | ICD-10-CM | POA: Insufficient documentation

## 2015-11-06 DIAGNOSIS — M7542 Impingement syndrome of left shoulder: Secondary | ICD-10-CM | POA: Insufficient documentation

## 2015-11-06 DIAGNOSIS — Z79890 Hormone replacement therapy (postmenopausal): Secondary | ICD-10-CM | POA: Insufficient documentation

## 2015-11-06 DIAGNOSIS — Z823 Family history of stroke: Secondary | ICD-10-CM | POA: Insufficient documentation

## 2015-11-06 DIAGNOSIS — E785 Hyperlipidemia, unspecified: Secondary | ICD-10-CM | POA: Insufficient documentation

## 2015-11-06 DIAGNOSIS — X58XXXA Exposure to other specified factors, initial encounter: Secondary | ICD-10-CM | POA: Insufficient documentation

## 2015-11-06 DIAGNOSIS — I10 Essential (primary) hypertension: Secondary | ICD-10-CM | POA: Insufficient documentation

## 2015-11-06 DIAGNOSIS — Z90710 Acquired absence of both cervix and uterus: Secondary | ICD-10-CM | POA: Insufficient documentation

## 2015-11-06 DIAGNOSIS — M19012 Primary osteoarthritis, left shoulder: Secondary | ICD-10-CM | POA: Insufficient documentation

## 2015-11-06 DIAGNOSIS — Z880 Allergy status to penicillin: Secondary | ICD-10-CM | POA: Insufficient documentation

## 2015-11-06 DIAGNOSIS — S43432A Superior glenoid labrum lesion of left shoulder, initial encounter: Secondary | ICD-10-CM | POA: Insufficient documentation

## 2015-11-06 DIAGNOSIS — M545 Low back pain: Secondary | ICD-10-CM | POA: Insufficient documentation

## 2015-11-06 DIAGNOSIS — G8929 Other chronic pain: Secondary | ICD-10-CM | POA: Insufficient documentation

## 2015-11-06 DIAGNOSIS — Z9889 Other specified postprocedural states: Secondary | ICD-10-CM

## 2015-11-06 HISTORY — DX: Failed or difficult intubation, initial encounter: T88.4XXA

## 2015-11-06 HISTORY — DX: Nausea with vomiting, unspecified: R11.2

## 2015-11-06 HISTORY — DX: Other specified postprocedural states: Z98.890

## 2015-11-06 HISTORY — DX: Essential (primary) hypertension: I10

## 2015-11-06 HISTORY — DX: Unspecified temporomandibular joint disorder, unspecified side: M26.609

## 2015-11-06 SURGERY — SHOULDER ARTHROSCOPY WITH SUBACROMIAL DECOMPRESSION AND DISTAL CLAVICLE EXCISION
Anesthesia: Regional | Site: Shoulder | Laterality: Left

## 2015-11-06 MED ORDER — OXYCODONE HCL 5 MG PO TABS
5.0000 mg | ORAL_TABLET | ORAL | Status: DC | PRN
  Administered 2015-11-06 – 2015-11-07 (×4): 10 mg via ORAL
  Filled 2015-11-06 (×4): qty 2

## 2015-11-06 MED ORDER — MIDAZOLAM HCL 2 MG/2ML IJ SOLN
INTRAMUSCULAR | Status: AC
  Filled 2015-11-06: qty 2

## 2015-11-06 MED ORDER — CHLORHEXIDINE GLUCONATE 4 % EX LIQD: 60.0000 mL | Freq: Once | CUTANEOUS | Status: DC

## 2015-11-06 MED ORDER — SODIUM CHLORIDE 0.9 % IV SOLN: INTRAVENOUS | Status: DC

## 2015-11-06 MED ORDER — LIDOCAINE 2% (20 MG/ML) 5 ML SYRINGE
INTRAMUSCULAR | Status: AC
  Filled 2015-11-06: qty 5

## 2015-11-06 MED ORDER — ONDANSETRON HCL 4 MG PO TABS: 4.0000 mg | ORAL_TABLET | Freq: Four times a day (QID) | ORAL | Status: DC | PRN

## 2015-11-06 MED ORDER — METHOCARBAMOL 750 MG PO TABS: 750.0000 mg | ORAL_TABLET | Freq: Four times a day (QID) | ORAL | Status: DC | PRN

## 2015-11-06 MED ORDER — SCOPOLAMINE 1 MG/3DAYS TD PT72
1.0000 | MEDICATED_PATCH | Freq: Once | TRANSDERMAL | Status: DC | PRN
  Administered 2015-11-06: 1.5 mg via TRANSDERMAL

## 2015-11-06 MED ORDER — LACTATED RINGERS IV SOLN
INTRAVENOUS | Status: DC
  Administered 2015-11-06: 10 mL/h via INTRAVENOUS
  Administered 2015-11-06: 14:00:00 via INTRAVENOUS

## 2015-11-06 MED ORDER — OXYCODONE-ACETAMINOPHEN 5-325 MG PO TABS: 1.0000 | ORAL_TABLET | ORAL | 0 refills | Status: DC | PRN

## 2015-11-06 MED ORDER — CEFAZOLIN SODIUM-DEXTROSE 2-4 GM/100ML-% IV SOLN
INTRAVENOUS | Status: AC
  Filled 2015-11-06: qty 100

## 2015-11-06 MED ORDER — CLINDAMYCIN PHOSPHATE 900 MG/50ML IV SOLN
900.0000 mg | INTRAVENOUS | Status: AC
  Administered 2015-11-06: 900 mg via INTRAVENOUS

## 2015-11-06 MED ORDER — FENTANYL CITRATE (PF) 100 MCG/2ML IJ SOLN
INTRAMUSCULAR | Status: AC
  Filled 2015-11-06: qty 2

## 2015-11-06 MED ORDER — CLINDAMYCIN PHOSPHATE 900 MG/50ML IV SOLN
INTRAVENOUS | Status: AC
  Filled 2015-11-06: qty 50

## 2015-11-06 MED ORDER — SUCCINYLCHOLINE CHLORIDE 20 MG/ML IJ SOLN
INTRAMUSCULAR | Status: DC | PRN
  Administered 2015-11-06: 100 mg via INTRAVENOUS

## 2015-11-06 MED ORDER — CLINDAMYCIN PHOSPHATE 600 MG/50ML IV SOLN
INTRAVENOUS | Status: AC
  Filled 2015-11-06: qty 50

## 2015-11-06 MED ORDER — METOCLOPRAMIDE HCL 5 MG/ML IJ SOLN: 5.0000 mg | Freq: Three times a day (TID) | INTRAMUSCULAR | Status: DC | PRN

## 2015-11-06 MED ORDER — HYDROMORPHONE HCL 1 MG/ML IJ SOLN: 0.2500 mg | INTRAMUSCULAR | Status: DC | PRN

## 2015-11-06 MED ORDER — PROMETHAZINE HCL 25 MG/ML IJ SOLN
6.2500 mg | INTRAMUSCULAR | Status: DC | PRN
  Administered 2015-11-06: 6.25 mg via INTRAVENOUS

## 2015-11-06 MED ORDER — DEXAMETHASONE SODIUM PHOSPHATE 4 MG/ML IJ SOLN
INTRAMUSCULAR | Status: DC | PRN
  Administered 2015-11-06: 10 mg via INTRAVENOUS

## 2015-11-06 MED ORDER — SENNOSIDES-DOCUSATE SODIUM 8.6-50 MG PO TABS: 1.0000 | ORAL_TABLET | Freq: Every evening | ORAL | Status: DC | PRN

## 2015-11-06 MED ORDER — SCOPOLAMINE 1 MG/3DAYS TD PT72
MEDICATED_PATCH | TRANSDERMAL | Status: AC
  Filled 2015-11-06: qty 1

## 2015-11-06 MED ORDER — DEXAMETHASONE SODIUM PHOSPHATE 10 MG/ML IJ SOLN
INTRAMUSCULAR | Status: AC
  Filled 2015-11-06: qty 1

## 2015-11-06 MED ORDER — METOCLOPRAMIDE HCL 5 MG PO TABS: 5.0000 mg | ORAL_TABLET | Freq: Three times a day (TID) | ORAL | Status: DC | PRN

## 2015-11-06 MED ORDER — ONDANSETRON HCL 4 MG/2ML IJ SOLN
INTRAMUSCULAR | Status: AC
  Filled 2015-11-06: qty 2

## 2015-11-06 MED ORDER — PROMETHAZINE HCL 25 MG/ML IJ SOLN
INTRAMUSCULAR | Status: AC
  Filled 2015-11-06: qty 1

## 2015-11-06 MED ORDER — OXYCODONE-ACETAMINOPHEN 5-325 MG PO TABS: 1.0000 | ORAL_TABLET | ORAL | Status: DC | PRN

## 2015-11-06 MED ORDER — HYDROMORPHONE HCL 1 MG/ML IJ SOLN: 0.5000 mg | INTRAMUSCULAR | Status: DC | PRN

## 2015-11-06 MED ORDER — BISACODYL 10 MG RE SUPP: 10.0000 mg | Freq: Every day | RECTAL | Status: DC | PRN

## 2015-11-06 MED ORDER — PROPOFOL 10 MG/ML IV BOLUS
INTRAVENOUS | Status: DC | PRN
Start: 2015-11-06 — End: 2015-11-06
  Administered 2015-11-06: 200 mg via INTRAVENOUS

## 2015-11-06 MED ORDER — LIDOCAINE HCL (CARDIAC) 20 MG/ML IV SOLN
INTRAVENOUS | Status: DC | PRN
Start: 2015-11-06 — End: 2015-11-06
  Administered 2015-11-06: 80 mg via INTRAVENOUS

## 2015-11-06 MED ORDER — SODIUM CHLORIDE 0.9 % IV SOLN
INTRAVENOUS | Status: DC
  Administered 2015-11-06: 17:00:00 via INTRAVENOUS

## 2015-11-06 MED ORDER — MIDAZOLAM HCL 2 MG/2ML IJ SOLN
1.0000 mg | INTRAMUSCULAR | Status: DC | PRN
  Administered 2015-11-06: 2 mg via INTRAVENOUS

## 2015-11-06 MED ORDER — DOCUSATE SODIUM 100 MG PO CAPS: 100.0000 mg | ORAL_CAPSULE | Freq: Two times a day (BID) | ORAL | Status: DC

## 2015-11-06 MED ORDER — ONDANSETRON HCL 4 MG/2ML IJ SOLN
INTRAMUSCULAR | Status: DC | PRN
  Administered 2015-11-06: 4 mg via INTRAVENOUS

## 2015-11-06 MED ORDER — FLEET ENEMA 7-19 GM/118ML RE ENEM: 1.0000 | ENEMA | Freq: Once | RECTAL | Status: DC | PRN

## 2015-11-06 MED ORDER — CLINDAMYCIN PHOSPHATE 600 MG/50ML IV SOLN
600.0000 mg | Freq: Four times a day (QID) | INTRAVENOUS | Status: DC
  Administered 2015-11-06 – 2015-11-07 (×2): 600 mg via INTRAVENOUS

## 2015-11-06 MED ORDER — ARTIFICIAL TEARS OP OINT
TOPICAL_OINTMENT | OPHTHALMIC | Status: AC
  Filled 2015-11-06: qty 3.5

## 2015-11-06 MED ORDER — GLYCOPYRROLATE 0.2 MG/ML IJ SOLN: 0.2000 mg | Freq: Once | INTRAMUSCULAR | Status: DC | PRN

## 2015-11-06 MED ORDER — ONDANSETRON HCL 4 MG/2ML IJ SOLN: 4.0000 mg | Freq: Four times a day (QID) | INTRAMUSCULAR | Status: DC | PRN

## 2015-11-06 MED ORDER — FENTANYL CITRATE (PF) 100 MCG/2ML IJ SOLN
50.0000 ug | INTRAMUSCULAR | Status: DC | PRN
  Administered 2015-11-06: 50 ug via INTRAVENOUS
  Administered 2015-11-06: 100 ug via INTRAVENOUS

## 2015-11-06 SURGICAL SUPPLY — 88 items
AID PSTN UNV HD RSTRNT DISP (MISCELLANEOUS) ×2
APL SKNCLS STERI-STRIP NONHPOA (GAUZE/BANDAGES/DRESSINGS)
BENZOIN TINCTURE PRP APPL 2/3 (GAUZE/BANDAGES/DRESSINGS) IMPLANT
BLADE 4.2CUDA (BLADE) ×4 IMPLANT
BLADE AVERAGE 25MMX9MM (BLADE)
BLADE AVERAGE 25X9 (BLADE) IMPLANT
BLADE CUTTER GATOR 3.5 (BLADE) IMPLANT
BLADE SURG 15 STRL LF DISP TIS (BLADE) IMPLANT
BLADE SURG 15 STRL SS (BLADE)
BLADE VORTEX 6.0 (BLADE) IMPLANT
BUR 3.5 LG SPHERICAL (BURR) IMPLANT
BUR EGG 3PK/BX (BURR) IMPLANT
BUR OVAL 4.0 (BURR) IMPLANT
BUR OVAL 6.0 (BURR) ×4 IMPLANT
BUR VERTEX HOODED 4.5 (BURR) IMPLANT
BURR 3.5 LG SPHERICAL (BURR)
BURR 3.5MM LG SPHERICAL (BURR)
CANNULA SHOULDER 7CM (CANNULA) ×4 IMPLANT
CANNULA TWIST IN 8.25X7CM (CANNULA) IMPLANT
CLEANER CAUTERY TIP 5X5 PAD (MISCELLANEOUS) IMPLANT
CLOSURE WOUND 1/2 X4 (GAUZE/BANDAGES/DRESSINGS)
CUTTER MENISCUS  4.2MM (BLADE)
CUTTER MENISCUS 4.2MM (BLADE) IMPLANT
DECANTER SPIKE VIAL GLASS SM (MISCELLANEOUS) IMPLANT
DRAPE STERI 35X30 U-POUCH (DRAPES) ×4 IMPLANT
DRAPE SURG 17X23 STRL (DRAPES) ×4 IMPLANT
DRAPE U-SHAPE 76X120 STRL (DRAPES) ×8 IMPLANT
DRSG EMULSION OIL 3X3 NADH (GAUZE/BANDAGES/DRESSINGS) ×4 IMPLANT
DRSG PAD ABDOMINAL 8X10 ST (GAUZE/BANDAGES/DRESSINGS) ×4 IMPLANT
DURAPREP 26ML APPLICATOR (WOUND CARE) ×4 IMPLANT
ELECT REM PT RETURN 9FT ADLT (ELECTROSURGICAL) ×4
ELECTRODE REM PT RTRN 9FT ADLT (ELECTROSURGICAL) ×2 IMPLANT
GAUZE SPONGE 4X4 12PLY STRL (GAUZE/BANDAGES/DRESSINGS) ×4 IMPLANT
GLOVE BIO SURGEON STRL SZ 6.5 (GLOVE) ×2 IMPLANT
GLOVE BIO SURGEON STRL SZ7.5 (GLOVE) ×4 IMPLANT
GLOVE BIO SURGEONS STRL SZ 6.5 (GLOVE) ×1
GLOVE BIOGEL PI IND STRL 7.0 (GLOVE) ×2 IMPLANT
GLOVE BIOGEL PI IND STRL 8 (GLOVE) ×4 IMPLANT
GLOVE BIOGEL PI INDICATOR 7.0 (GLOVE) ×4
GLOVE BIOGEL PI INDICATOR 8 (GLOVE) ×4
GLOVE SURG ORTHO 8.0 STRL STRW (GLOVE) ×4 IMPLANT
GOWN STRL REUS W/ TWL LRG LVL3 (GOWN DISPOSABLE) ×3 IMPLANT
GOWN STRL REUS W/ TWL XL LVL3 (GOWN DISPOSABLE) ×2 IMPLANT
GOWN STRL REUS W/TWL LRG LVL3 (GOWN DISPOSABLE) ×8
GOWN STRL REUS W/TWL XL LVL3 (GOWN DISPOSABLE) ×8 IMPLANT
MANIFOLD NEPTUNE II (INSTRUMENTS) ×4 IMPLANT
NDL 1/2 CIR CATGUT .05X1.09 (NEEDLE) IMPLANT
NDL SCORPION MULTI FIRE (NEEDLE) IMPLANT
NEEDLE 1/2 CIR CATGUT .05X1.09 (NEEDLE) IMPLANT
NEEDLE SCORPION MULTI FIRE (NEEDLE) IMPLANT
NS IRRIG 1000ML POUR BTL (IV SOLUTION) ×4 IMPLANT
PACK ARTHROSCOPY DSU (CUSTOM PROCEDURE TRAY) ×4 IMPLANT
PACK BASIN DAY SURGERY FS (CUSTOM PROCEDURE TRAY) ×4 IMPLANT
PAD CLEANER CAUTERY TIP 5X5 (MISCELLANEOUS)
PAD ORTHO SHOULDER 7X19 LRG (SOFTGOODS) IMPLANT
PENCIL BUTTON HOLSTER BLD 10FT (ELECTRODE) IMPLANT
PROBE BIPOLAR ATHRO 135MM 90D (MISCELLANEOUS) IMPLANT
RESTRAINT HEAD UNIVERSAL NS (MISCELLANEOUS) ×4 IMPLANT
SET ARTHROSCOPY TUBING (MISCELLANEOUS) ×4
SET ARTHROSCOPY TUBING LN (MISCELLANEOUS) ×2 IMPLANT
SLEEVE SCD COMPRESS KNEE MED (MISCELLANEOUS) ×4 IMPLANT
SLING ARM FOAM STRAP LRG (SOFTGOODS) ×3 IMPLANT
SLING ARM MED ADULT FOAM STRAP (SOFTGOODS) IMPLANT
SLING ULTRA II MEDIUM (SOFTGOODS) IMPLANT
SLING ULTRA II SMALL (SOFTGOODS) IMPLANT
SPONGE LAP 4X18 X RAY DECT (DISPOSABLE) IMPLANT
STAPLER VISISTAT 35W (STAPLE) IMPLANT
STRIP CLOSURE SKIN 1/2X4 (GAUZE/BANDAGES/DRESSINGS) IMPLANT
SUCTION FRAZIER HANDLE 10FR (MISCELLANEOUS)
SUCTION TUBE FRAZIER 10FR DISP (MISCELLANEOUS) IMPLANT
SUT BONE WAX W31G (SUTURE) IMPLANT
SUT ETHILON 3 0 PS 1 (SUTURE) ×4 IMPLANT
SUT FIBERWIRE #2 38 T-5 BLUE (SUTURE)
SUT MNCRL AB 3-0 PS2 18 (SUTURE) IMPLANT
SUT TICRON 1 T 12 (SUTURE) IMPLANT
SUT TIGER TAPE 7 IN WHITE (SUTURE) IMPLANT
SUT VIC AB 0 CT1 27 (SUTURE)
SUT VIC AB 0 CT1 27XBRD ANBCTR (SUTURE) IMPLANT
SUT VIC AB 1 CT1 27 (SUTURE)
SUT VIC AB 1 CT1 27XBRD ANBCTR (SUTURE) IMPLANT
SUT VIC AB 2-0 SH 27 (SUTURE)
SUT VIC AB 2-0 SH 27XBRD (SUTURE) IMPLANT
SUTURE FIBERWR #2 38 T-5 BLUE (SUTURE) IMPLANT
TAPE FIBER 2MM 7IN #2 BLUE (SUTURE) IMPLANT
TOWEL OR 17X24 6PK STRL BLUE (TOWEL DISPOSABLE) ×4 IMPLANT
TOWEL OR NON WOVEN STRL DISP B (DISPOSABLE) ×5 IMPLANT
WATER STERILE IRR 1000ML POUR (IV SOLUTION) ×4 IMPLANT
YANKAUER SUCT BULB TIP NO VENT (SUCTIONS) IMPLANT

## 2015-11-06 NOTE — Interval H&P Note (Signed)
History and Physical Interval Note:  11/06/2015 1:00 PM  Diana Browning  has presented today for surgery, with the diagnosis of burstis left shoulder, rotator cuff rupture,   The various methods of treatment have been discussed with the patient and family. After consideration of risks, benefits and other options for treatment, the patient has consented to  Procedure(s) with comments: SHOULDER ARTHROSCOPY WITH ROTATOR CUFF REPAIR AND SUBACROMIAL DECOMPRESSION, partial acromioplasty with debridement (Left) - Pre/Post Op Scalene block possible, ROTATOR CUFF REPAIR SHOULDER OPEN (Left) as a surgical intervention .  The patient's history has been reviewed, patient examined, no change in status, stable for surgery.  I have reviewed the patient's chart and labs.  Questions were answered to the patient's satisfaction.     Diana Browning JR,W D

## 2015-11-06 NOTE — Brief Op Note (Signed)
11/06/2015  2:24 PM  PATIENT:  Alene Mires  55 y.o. female  PRE-OPERATIVE DIAGNOSIS:  burstis left shoulder, rotator cuff rupture  POST-OPERATIVE DIAGNOSIS:  burstis left shoulder  PROCEDURE:  Procedure(s) with comments: SHOULDER ARTHROSCOPY WITH SUBACROMIAL DECOMPRESSION, partial acromioplasty with debridement (Left) - Pre/Post Op Scalene block  SURGEON:  Surgeon(s) and Role:    * Earlie Server, MD - Primary  PHYSICIAN ASSISTANT: Chriss Czar, PA-C  ASSISTANTS:    ANESTHESIA:   regional and general  EBL:  Total I/O In: 1200 [I.V.:1200] Out: 25 [Blood:25]  BLOOD ADMINISTERED:none  DRAINS: none   LOCAL MEDICATIONS USED:  NONE  SPECIMEN:  No Specimen  DISPOSITION OF SPECIMEN:  N/A  COUNTS:  YES  TOURNIQUET:  * No tourniquets in log *  DICTATION: .Other Dictation: Dictation Number Unknown  PLAN OF CARE: Admit for overnight observation  PATIENT DISPOSITION:  PACU - hemodynamically stable.   Delay start of Pharmacological VTE agent (>24hrs) due to surgical blood loss or risk of bleeding: not applicable

## 2015-11-06 NOTE — Anesthesia Postprocedure Evaluation (Signed)
Anesthesia Post Note  Patient: Diana Browning  Procedure(s) Performed: Procedure(s) (LRB): SHOULDER ARTHROSCOPY WITH SUBACROMIAL DECOMPRESSION, partial acromioplasty with debridement (Left)  Patient location during evaluation: PACU Anesthesia Type: General and Regional Level of consciousness: awake and alert Pain management: pain level controlled Vital Signs Assessment: post-procedure vital signs reviewed and stable Respiratory status: spontaneous breathing, nonlabored ventilation, respiratory function stable and patient connected to nasal cannula oxygen Cardiovascular status: blood pressure returned to baseline and stable Postop Assessment: no signs of nausea or vomiting Anesthetic complications: no    Last Vitals:  Vitals:   11/06/15 1600 11/06/15 1630  BP: 129/86 (!) 143/83  Pulse: 82 89  Resp: 14 16  Temp:  36.9 C    Last Pain:  Vitals:   11/06/15 1630  TempSrc:   PainSc: 0-No pain                 Garry Nicolini,JAMES TERRILL

## 2015-11-06 NOTE — Anesthesia Preprocedure Evaluation (Signed)
Anesthesia Evaluation  Patient identified by MRN, date of birth, ID band Patient awake    Reviewed: Allergy & Precautions, NPO status , Patient's Chart, lab work & pertinent test results  History of Anesthesia Complications (+) PONV, DIFFICULT AIRWAY and history of anesthetic complications  Airway Mallampati: II  TM Distance: >3 FB Neck ROM: Full    Dental  (+) Dental Advisory Given   Pulmonary former smoker,    breath sounds clear to auscultation       Cardiovascular hypertension,  Rhythm:Regular Rate:Normal     Neuro/Psych  Headaches, Has TMJ    GI/Hepatic hiatal hernia, GERD  ,  Endo/Other    Renal/GU      Musculoskeletal   Abdominal   Peds  Hematology   Anesthesia Other Findings   Reproductive/Obstetrics                             Anesthesia Physical Anesthesia Plan  ASA: II  Anesthesia Plan: General   Post-op Pain Management:    Induction: Intravenous  Airway Management Planned: Oral ETT  Additional Equipment:   Intra-op Plan:   Post-operative Plan: Extubation in OR  Informed Consent: I have reviewed the patients History and Physical, chart, labs and discussed the procedure including the risks, benefits and alternatives for the proposed anesthesia with the patient or authorized representative who has indicated his/her understanding and acceptance.   Dental advisory given  Plan Discussed with: CRNA  Anesthesia Plan Comments:         Anesthesia Quick Evaluation

## 2015-11-06 NOTE — Anesthesia Procedure Notes (Signed)
Procedure Name: Intubation Date/Time: 11/06/2015 1:21 PM Performed by: Baxter Flattery Pre-anesthesia Checklist: Patient identified, Emergency Drugs available, Suction available and Patient being monitored Patient Re-evaluated:Patient Re-evaluated prior to inductionOxygen Delivery Method: Circle system utilized Preoxygenation: Pre-oxygenation with 100% oxygen Intubation Type: IV induction Ventilation: Mask ventilation without difficulty Laryngoscope Size: Miller and 2 Grade View: Grade II Tube type: Oral Tube size: 7.0 mm Number of attempts: 1 Airway Equipment and Method: Stylet Placement Confirmation: ETT inserted through vocal cords under direct vision,  positive ETCO2 and breath sounds checked- equal and bilateral Secured at: 21 cm Tube secured with: Tape Dental Injury: Teeth and Oropharynx as per pre-operative assessment

## 2015-11-06 NOTE — Progress Notes (Signed)
Assisted Dr. Massagee with left, ultrasound guided, interscalene  block. Side rails up, monitors on throughout procedure. See vital signs in flow sheet. Tolerated Procedure well. 

## 2015-11-06 NOTE — Transfer of Care (Signed)
Immediate Anesthesia Transfer of Care Note  Patient: Diana Browning  Procedure(s) Performed: Procedure(s) with comments: SHOULDER ARTHROSCOPY WITH SUBACROMIAL DECOMPRESSION, partial acromioplasty with debridement (Left) - Pre/Post Op Scalene block  Patient Location: PACU  Anesthesia Type:GA combined with regional for post-op pain  Level of Consciousness: awake, alert  and patient cooperative  Airway & Oxygen Therapy: Patient Spontanous Breathing and Patient connected to face mask oxygen  Post-op Assessment: Report given to RN, Post -op Vital signs reviewed and stable and Patient moving all extremities  Post vital signs: Reviewed and stable  Last Vitals:  Vitals:   11/06/15 1150 11/06/15 1200  BP:  132/75  Pulse: 79 92  Resp: 15 19  Temp:      Last Pain:  Vitals:   11/06/15 1104  TempSrc: Oral  PainSc: 5          Complications: No apparent anesthesia complications

## 2015-11-06 NOTE — Discharge Instructions (Signed)
Diet: As you were doing prior to hospitalization   Activity: Increase activity slowly as tolerated  No lifting or driving for 48 hours   Shower: May shower without a dressing on post op day #2, NO SOAKING in tub   Dressing: You may change your dressing on post op day #2.  Then change the dressing daily with sterile 4"x4"s gauze dressing  Or band aids.  Weight Bearing: minimal weight bearing left arm, may come out of sling in 48 hours as tolerated.    To prevent constipation: you may use a stool softener such as -  Colace ( over the counter) 100 mg by mouth twice a day  Drink plenty of fluids ( prune juice may be helpful) and high fiber foods  Miralax ( over the counter) for constipation as needed.   Precautions: If you experience chest pain or shortness of breath - call 911 immediately For transfer to the hospital emergency department!!  If you develop a fever greater that 101 F, purulent drainage from wound, increased redness or drainage from wound, or calf pain -- Call the office   Follow- Up Appointment: Please call for an appointment to be seen in 1 week  [place] - [phone]

## 2015-11-06 NOTE — H&P (View-Only) (Signed)
Diana Browning is an 55 y.o. female.   Chief Complaint: left shoulder rotator cuff tear  HPI: Diana Browning continues to struggle with severe left-sided shoulder pain that radiates up into the neck and then down the upper arm.  Injection back in August was only minimally helpful.  She went back to see Dr. Sherwood Gambler who did her neck surgery and he repeated cervical MRI according to the patient and she was told that everything was fine and there was no new nerve root impingement.  We know that she had rotator cuff disease on her right side and underwent arthroscopy and subacromial decompression which was helpful. Left shoulder MRI showed  Partial rotator cuff tear  Past Medical History:  Diagnosis Date  . Anxiety   . Chronic low back pain   . Difficult intubation    pt states that she has TMJ and it is difficult to open her mouth wide.  . Dyslipidemia   . Fasciculations of muscle 04/20/2013  . GERD (gastroesophageal reflux disease)   . Hiatal hernia   . Hypertension   . Migraine   . Muscle cramping 04/20/2013  . PONV (postoperative nausea and vomiting)   . TMJ dysfunction     Past Surgical History:  Procedure Laterality Date  . CHOLECYSTECTOMY    . LAPAROSCOPIC HYSTERECTOMY    . TONSILLECTOMY      Family History  Problem Relation Age of Onset  . Stroke Father   . COPD Sister    Social History:  reports that she has quit smoking. She has never used smokeless tobacco. She reports that she does not drink alcohol or use drugs.  Allergies:  Allergies  Allergen Reactions  . Penicillins     Unknown reaction     (Not in a hospital admission)  No results found for this or any previous visit (from the past 48 hour(s)). No results found.  Review of Systems  Musculoskeletal: Positive for joint pain.  Psychiatric/Behavioral: Positive for depression. The patient is nervous/anxious.   All other systems reviewed and are negative.   There were no vitals taken for this visit. Physical Exam   Constitutional: She is oriented to person, place, and time. She appears well-developed and well-nourished. No distress.  HENT:  Head: Normocephalic and atraumatic.  Nose: Nose normal.  Eyes: Conjunctivae and EOM are normal. Pupils are equal, round, and reactive to light.  Neck: Normal range of motion. Neck supple.  Cardiovascular: Normal rate and intact distal pulses.   Respiratory: Effort normal. No respiratory distress.  GI: Soft. She exhibits no distension. There is no tenderness.  Musculoskeletal:       Left shoulder: She exhibits decreased range of motion, tenderness, pain and decreased strength.  Neurological: She is alert and oriented to person, place, and time.  Skin: Skin is warm and dry. No rash noted. No erythema.  Psychiatric: She has a normal mood and affect. Her behavior is normal.     Assessment/Plan Left shoulder rotator cuff tear  Candidate for arthroscopy, exam under anesthesia, debridement.  Would lean against distal clavicle hopefully not needing a repair, but she does have an objective partial tear.  Risks and benefits discussed in detail.  Proceed on with scheduling for the left shoulder.  Chriss Czar, PA-C 10/31/2015, 2:05 PM

## 2015-11-07 ENCOUNTER — Encounter (HOSPITAL_BASED_OUTPATIENT_CLINIC_OR_DEPARTMENT_OTHER): Payer: Self-pay | Admitting: Orthopedic Surgery

## 2015-11-07 DIAGNOSIS — M75112 Incomplete rotator cuff tear or rupture of left shoulder, not specified as traumatic: Secondary | ICD-10-CM | POA: Diagnosis not present

## 2015-11-07 MED ORDER — CLINDAMYCIN PHOSPHATE 600 MG/50ML IV SOLN
INTRAVENOUS | Status: AC
  Filled 2015-11-07: qty 50

## 2015-11-07 NOTE — Op Note (Signed)
NAME:  Diana, Browning                 ACCOUNT NO.:  [id number]  MEDICAL RECORD NO.:  [id number]  LOCATION:                                 FACILITY:  PHYSICIAN:  Lockie Pares, M.D.    DATE OF BIRTH:  10/16/1960  DATE OF PROCEDURE:  11/06/2015 DATE OF DISCHARGE:                              OPERATIVE REPORT   PREOPERATIVE DIAGNOSES: 1. Diffuse articular sided partial rotator cuff tear, left shoulder. 2. Impingement. 3. AC joint arthritis. 4. Tearing of anterior, superior, posterior labrum.  POSTOPERATIVE DIAGNOSES: 1. Diffuse articular sided partial rotator cuff tear, left shoulder. 2. Impingement. 3. AC joint arthritis. 4. Tearing of anterior, superior, posterior labrum.  OPERATION: 1. Arthroscopic debridement (extensive). 2. Arthroscopic acromioplasty. 3. Arthroscopic distal clavicle excision, all for the left shoulder.  SURGEON:  Lockie Pares, M.D.  ASSISTANT:  Chriss Czar, PA-C.  ANESTHESIA:  General with a nerve block.  DESCRIPTION OF PROCEDURE:  She was arthroscoped in beach chair position. The posterolateral and anterior portals.  Systematic inspection of the shoulder showed the patient to have a full width partial tear involving infraspinatus approaching, but not quite 50%, some loss of footprint, but no full-thickness component appreciated.  Moderately significant tearing of the labrum, typically posterior was noted of a degenerative nature, but also superior and anterior, which was debrided.  No glenohumeral degenerative change was noted.  Extensive debridement intra- articularly was carried out.  The bursa was hypertrophied and very inflamed.  Significant impingement from the CA ligament as well as the edge of the acromion was noted, performed acromioplasty, release of the CA ligament.  There was definitely arthritis of AC joint, particularly with a very prominent distal clavicle inferiorly which could be contributing to impingement.  Therefore, we excised  a little bit less than 1 cm of the distal clavicle.  Again, no full-thickness tear was appreciated on the bursal side.  Complete bursectomy carried out.  Shoulder drained free of fluid. Portals closed with nylon.  Taken to the recovery room in stable condition with a sling.     Lockie Pares, M.D.   ______________________________ Lockie Pares, M.D.    WDC/MEDQ  D:  11/06/2015  T:  11/07/2015  Job:  [id number]

## 2015-11-13 ENCOUNTER — Encounter (HOSPITAL_BASED_OUTPATIENT_CLINIC_OR_DEPARTMENT_OTHER): Payer: Self-pay | Admitting: Orthopedic Surgery

## 2016-02-11 DIAGNOSIS — M25519 Pain in unspecified shoulder: Secondary | ICD-10-CM | POA: Insufficient documentation

## 2016-05-19 ENCOUNTER — Other Ambulatory Visit: Payer: Self-pay | Admitting: Orthopedic Surgery

## 2016-05-19 DIAGNOSIS — R0602 Shortness of breath: Secondary | ICD-10-CM

## 2016-05-19 DIAGNOSIS — R079 Chest pain, unspecified: Secondary | ICD-10-CM

## 2016-05-21 ENCOUNTER — Ambulatory Visit
Admission: RE | Admit: 2016-05-21 | Discharge: 2016-05-21 | Disposition: A | Discharge status: Home / Self Care | Payer: BLUE CROSS/BLUE SHIELD | Source: Ambulatory Visit | Attending: Orthopedic Surgery | Admitting: Orthopedic Surgery

## 2016-05-21 DIAGNOSIS — R079 Chest pain, unspecified: Secondary | ICD-10-CM

## 2016-05-21 DIAGNOSIS — R0602 Shortness of breath: Secondary | ICD-10-CM

## 2017-01-27 ENCOUNTER — Other Ambulatory Visit: Payer: Self-pay | Admitting: Physician Assistant

## 2017-01-27 DIAGNOSIS — R918 Other nonspecific abnormal finding of lung field: Secondary | ICD-10-CM

## 2017-01-27 DIAGNOSIS — R1013 Epigastric pain: Secondary | ICD-10-CM

## 2017-02-02 ENCOUNTER — Ambulatory Visit
Admission: RE | Admit: 2017-02-02 | Discharge: 2017-02-02 | Disposition: A | Discharge status: Home / Self Care | Payer: Medicare Other | Source: Ambulatory Visit | Attending: Physician Assistant | Admitting: Physician Assistant

## 2017-02-02 DIAGNOSIS — R1013 Epigastric pain: Secondary | ICD-10-CM

## 2017-02-05 ENCOUNTER — Other Ambulatory Visit: Payer: Self-pay | Admitting: Physician Assistant

## 2017-02-05 DIAGNOSIS — E278 Other specified disorders of adrenal gland: Secondary | ICD-10-CM

## 2017-02-13 ENCOUNTER — Other Ambulatory Visit: Payer: Medicare Other

## 2017-02-18 ENCOUNTER — Ambulatory Visit
Admission: RE | Admit: 2017-02-18 | Discharge: 2017-02-18 | Disposition: A | Discharge status: Home / Self Care | Payer: Medicare Other | Source: Ambulatory Visit | Attending: Physician Assistant | Admitting: Physician Assistant

## 2017-02-18 DIAGNOSIS — E278 Other specified disorders of adrenal gland: Secondary | ICD-10-CM

## 2017-02-27 ENCOUNTER — Ambulatory Visit
Admission: RE | Admit: 2017-02-27 | Discharge: 2017-02-27 | Disposition: A | Discharge status: Home / Self Care | Payer: Medicare Other | Source: Ambulatory Visit | Attending: Physician Assistant | Admitting: Physician Assistant

## 2017-02-27 MED ORDER — GADOBENATE DIMEGLUMINE 529 MG/ML IV SOLN
16.0000 mL | Freq: Once | INTRAVENOUS | Status: AC | PRN
  Administered 2017-02-27: 16 mL via INTRAVENOUS

## 2017-05-24 ENCOUNTER — Other Ambulatory Visit: Payer: BLUE CROSS/BLUE SHIELD

## 2017-06-08 ENCOUNTER — Inpatient Hospital Stay
Admission: RE | Admit: 2017-06-08 | Discharge: 2017-06-08 | Disposition: A | Discharge status: Home / Self Care | Payer: Medicare Other | Source: Ambulatory Visit | Attending: Physician Assistant | Admitting: Physician Assistant

## 2017-06-09 ENCOUNTER — Other Ambulatory Visit: Payer: Self-pay | Admitting: Orthopedic Surgery

## 2017-06-09 DIAGNOSIS — M542 Cervicalgia: Secondary | ICD-10-CM

## 2017-06-09 DIAGNOSIS — M5412 Radiculopathy, cervical region: Secondary | ICD-10-CM

## 2017-06-10 ENCOUNTER — Ambulatory Visit
Admission: RE | Admit: 2017-06-10 | Discharge: 2017-06-10 | Disposition: A | Discharge status: Home / Self Care | Payer: Medicare Other | Source: Ambulatory Visit | Attending: Orthopedic Surgery | Admitting: Orthopedic Surgery

## 2017-06-10 DIAGNOSIS — M5412 Radiculopathy, cervical region: Secondary | ICD-10-CM

## 2017-06-10 DIAGNOSIS — M542 Cervicalgia: Secondary | ICD-10-CM

## 2017-06-24 ENCOUNTER — Ambulatory Visit
Admission: RE | Admit: 2017-06-24 | Discharge: 2017-06-24 | Disposition: A | Discharge status: Home / Self Care | Payer: Medicare Other | Source: Ambulatory Visit | Attending: Physician Assistant | Admitting: Physician Assistant

## 2017-06-24 DIAGNOSIS — R918 Other nonspecific abnormal finding of lung field: Secondary | ICD-10-CM

## 2017-07-23 IMAGING — MR MR CERVICAL SPINE W/O CM
4 of 5 series · 30 of 48 positions shown · non-contrast
Comparison: MRI cervical spine 02/18/2013.

CLINICAL DATA: Numbness in both hands. Right shoulder and arm pain.
No known injury. Subsequent encounter.

EXAM:
MRI CERVICAL SPINE WITHOUT CONTRAST
TECHNIQUE: Multiplanar, multisequence MR imaging of the cervical spine was
performed. No intravenous contrast was administered.

[Series 2: T2 · sagittal · 3.0mm · 0.66mm/px · 8 of 13 slices shown (1 of 2)]
[im 1/13]
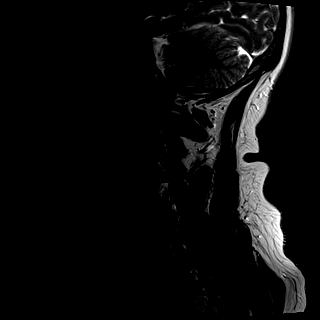
[im 2/13]
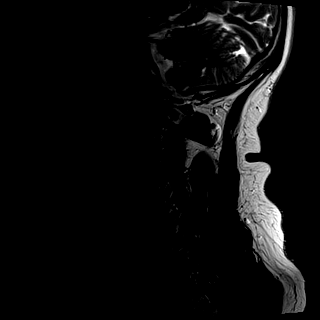
[im 4/13]
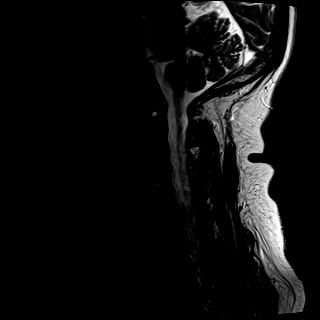
[im 6/13]
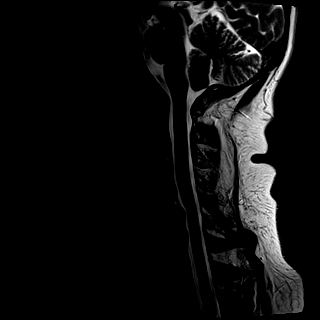
[im 7/13]
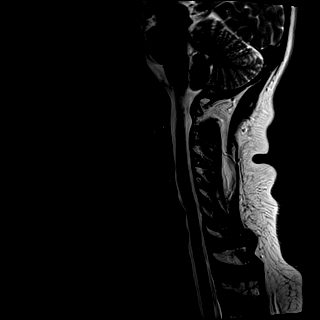
[im 9/13]
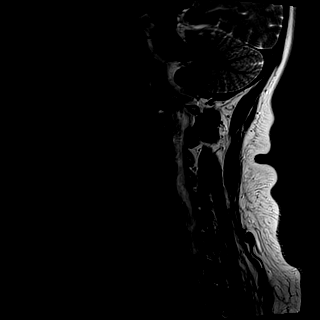
[im 11/13]
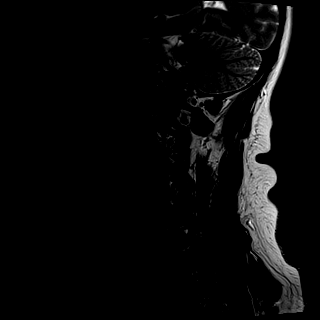
[im 13/13]
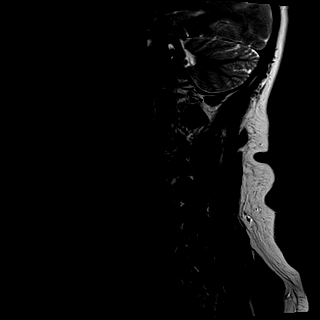

[Series 3: T1 · sagittal · 3.0mm · 0.41mm/px · 7 of 13 slices shown]
[im 1/13]
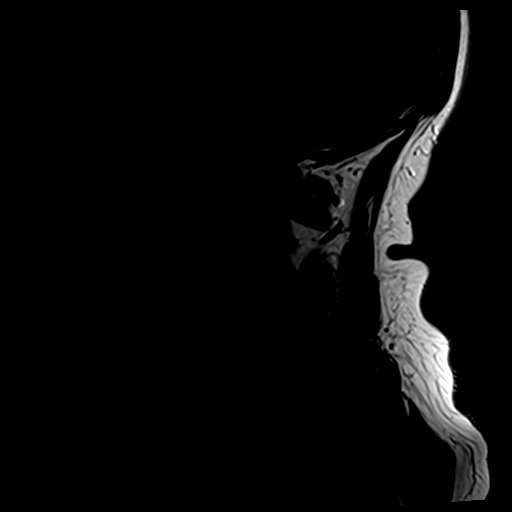
[im 3/13]
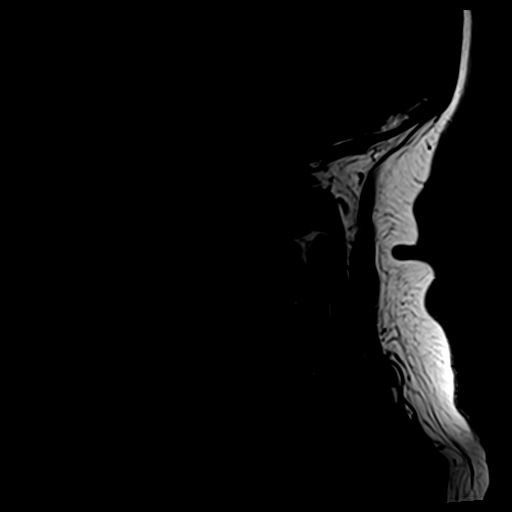
[im 5/13]
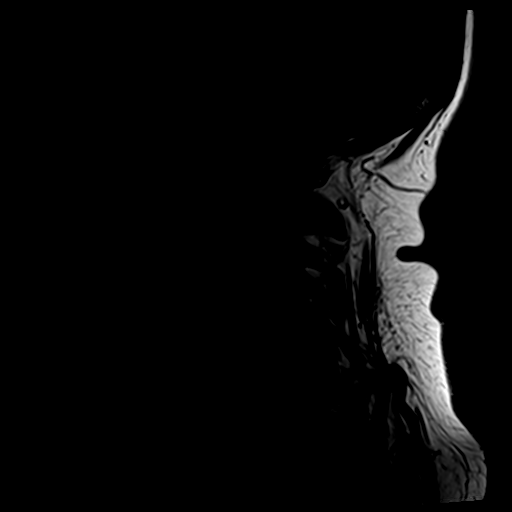
[im 7/13]
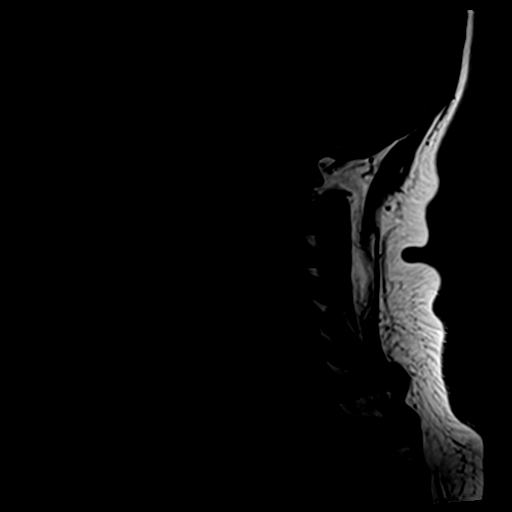
[im 9/13]
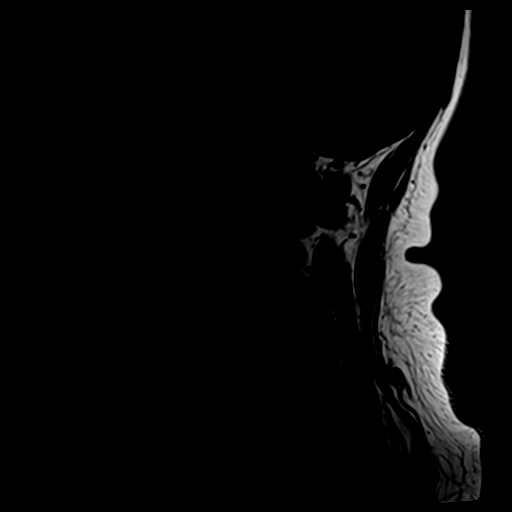
[im 11/13]
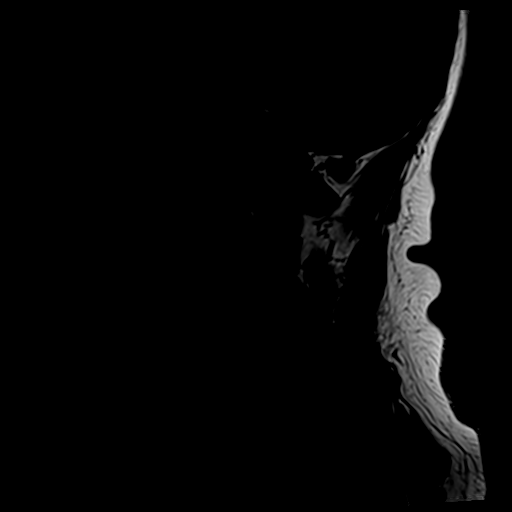
[im 13/13]
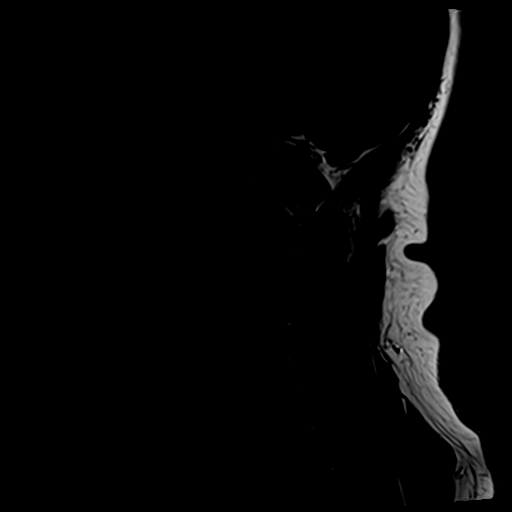

[Series 4: tir sag · sagittal · 3.0mm · 0.41mm/px · 6 of 13 slices shown]
[im 1/13]
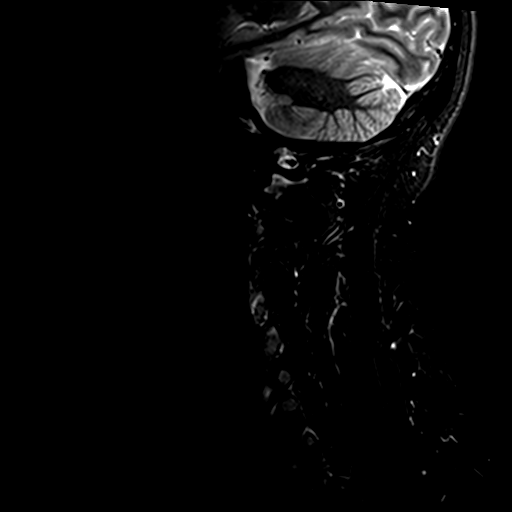
[im 3/13]
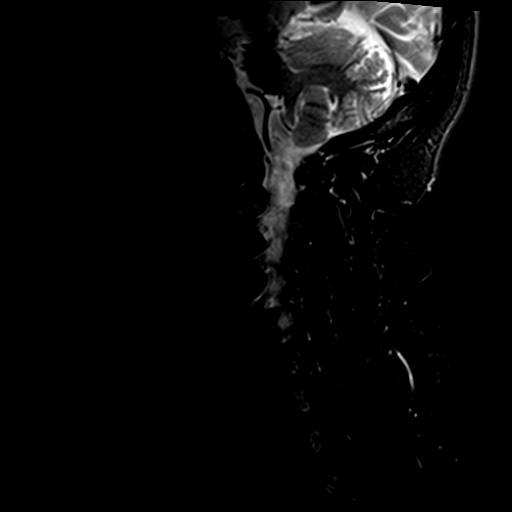
[im 5/13]
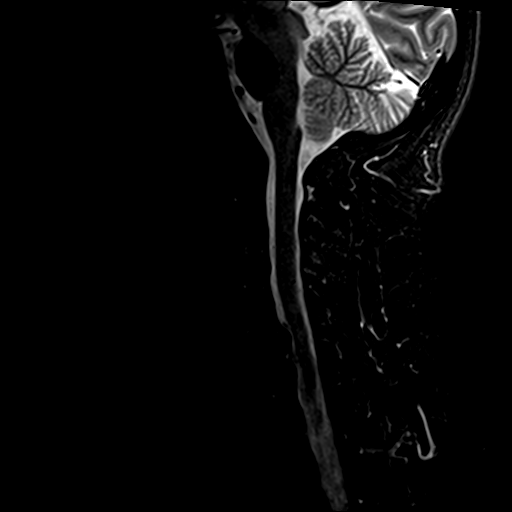
[im 7/13]
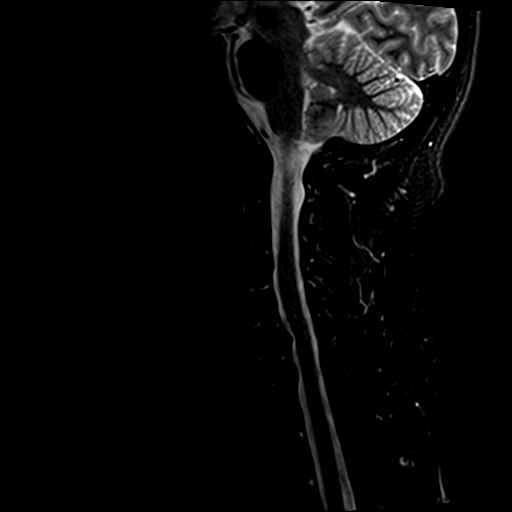
[im 9/13]
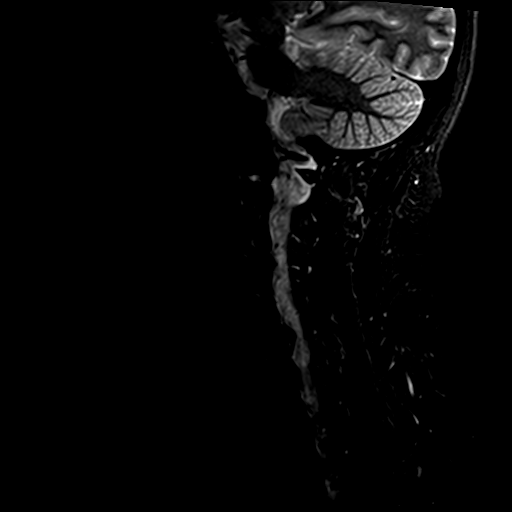
[im 11/13]
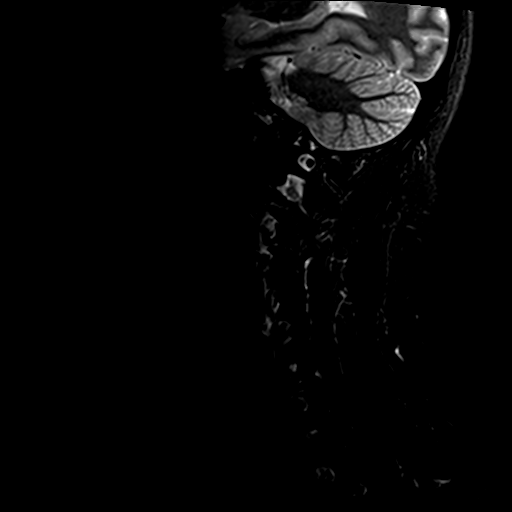

[Series 6: T2 · axial · 3.0mm · 0.70mm/px · z∈[-93,-11]mm · 9 of 25 slices shown (2 of 2)]
[im 1/25]
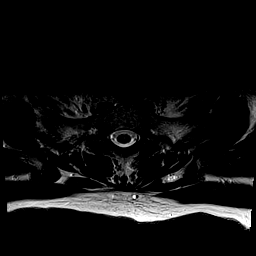
[im 5/25]
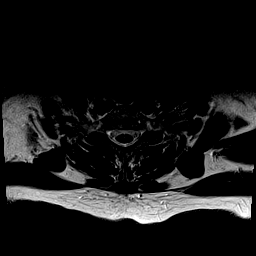
[im 9/25]
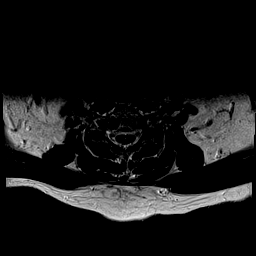
[im 11/25]
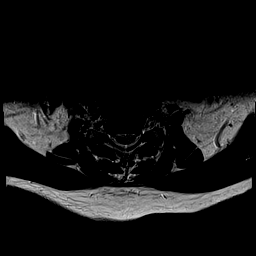
[im 13/25]
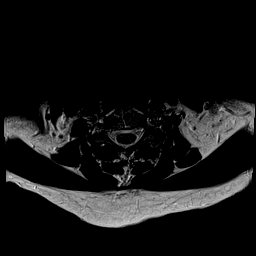
[im 15/25]
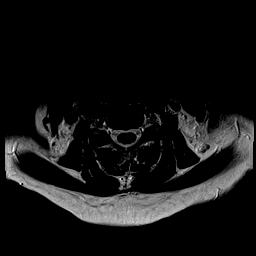
[im 17/25]
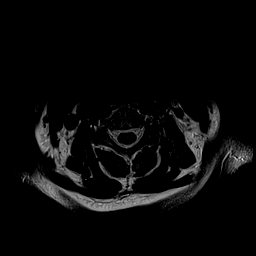
[im 21/25]
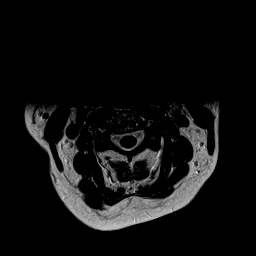
[im 25/25]
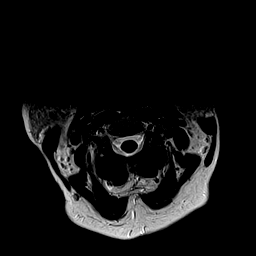

[30 of 48 positions shown; findings below may reference images not displayed]

FINDINGS: There is straightening of the normal cervical lordosis. Vertebral
body height, signal and alignment are maintained. The craniocervical
junction is normal and cervical cord signal is normal. Imaged
paraspinous structures are unremarkable.

C2-3:  Facet degenerative disease.  Otherwise negative.

C3-4: Mild uncovertebral disease is seen and there is some facet
arthropathy, more notable on the left. The central canal is widely
patent. The foramina are mildly narrowed.

C4-5:  Negative.

C5-6: Disc osteophyte complex to the right is identified.
Uncovertebral disease on the right appears somewhat worsened
compared to the prior examination. The ventral thecal sac is effaced
at this level. Moderate to moderately severe foraminal narrowing is
more notable on the right.

C6-7: There is a shallow disc bulge which narrows but does not
efface the ventral thecal sac. The foramina are open. The appearance
is unchanged.

C7-T1:  Negative.
IMPRESSION: Mild progression of spondylosis at C5-6 where a shallow disc bulge
and uncovertebral disease cause moderate to moderately severe
foraminal narrowing, worse on the right. The ventral thecal sac is
effaced at this level.

## 2018-11-04 DIAGNOSIS — I10 Essential (primary) hypertension: Secondary | ICD-10-CM | POA: Insufficient documentation

## 2019-03-23 ENCOUNTER — Other Ambulatory Visit: Payer: Self-pay | Admitting: Anesthesiology

## 2019-03-23 DIAGNOSIS — M5412 Radiculopathy, cervical region: Secondary | ICD-10-CM

## 2019-04-04 ENCOUNTER — Other Ambulatory Visit: Payer: Self-pay

## 2019-04-04 ENCOUNTER — Ambulatory Visit
Admission: RE | Admit: 2019-04-04 | Discharge: 2019-04-04 | Disposition: A | Discharge status: Home / Self Care | Payer: Medicare Other | Source: Ambulatory Visit | Attending: Anesthesiology | Admitting: Anesthesiology

## 2019-04-04 DIAGNOSIS — M5412 Radiculopathy, cervical region: Secondary | ICD-10-CM

## 2019-04-04 MED ORDER — GADOBENATE DIMEGLUMINE 529 MG/ML IV SOLN
15.0000 mL | Freq: Once | INTRAVENOUS | Status: AC | PRN
  Administered 2019-04-04: 15 mL via INTRAVENOUS

## 2019-04-10 ENCOUNTER — Other Ambulatory Visit: Payer: Medicare Other

## 2019-04-21 ENCOUNTER — Other Ambulatory Visit: Payer: Medicare Other

## 2020-01-17 DIAGNOSIS — M5030 Other cervical disc degeneration, unspecified cervical region: Secondary | ICD-10-CM | POA: Diagnosis not present

## 2020-01-17 DIAGNOSIS — G580 Intercostal neuropathy: Secondary | ICD-10-CM | POA: Diagnosis not present

## 2020-01-17 DIAGNOSIS — M19012 Primary osteoarthritis, left shoulder: Secondary | ICD-10-CM | POA: Diagnosis not present

## 2020-01-17 DIAGNOSIS — G894 Chronic pain syndrome: Secondary | ICD-10-CM | POA: Diagnosis not present

## 2020-01-30 DIAGNOSIS — Z1211 Encounter for screening for malignant neoplasm of colon: Secondary | ICD-10-CM | POA: Diagnosis not present

## 2020-01-30 DIAGNOSIS — E7800 Pure hypercholesterolemia, unspecified: Secondary | ICD-10-CM | POA: Diagnosis not present

## 2020-02-14 DIAGNOSIS — G580 Intercostal neuropathy: Secondary | ICD-10-CM | POA: Diagnosis not present

## 2020-02-14 DIAGNOSIS — M5030 Other cervical disc degeneration, unspecified cervical region: Secondary | ICD-10-CM | POA: Diagnosis not present

## 2020-02-14 DIAGNOSIS — M5136 Other intervertebral disc degeneration, lumbar region: Secondary | ICD-10-CM | POA: Diagnosis not present

## 2020-02-14 DIAGNOSIS — G894 Chronic pain syndrome: Secondary | ICD-10-CM | POA: Diagnosis not present

## 2020-03-19 DIAGNOSIS — M5030 Other cervical disc degeneration, unspecified cervical region: Secondary | ICD-10-CM | POA: Diagnosis not present

## 2020-03-19 DIAGNOSIS — M47897 Other spondylosis, lumbosacral region: Secondary | ICD-10-CM | POA: Diagnosis not present

## 2020-03-19 DIAGNOSIS — M5136 Other intervertebral disc degeneration, lumbar region: Secondary | ICD-10-CM | POA: Diagnosis not present

## 2020-03-19 DIAGNOSIS — M9941 Connective tissue stenosis of neural canal of cervical region: Secondary | ICD-10-CM | POA: Diagnosis not present

## 2020-03-19 DIAGNOSIS — G8929 Other chronic pain: Secondary | ICD-10-CM | POA: Diagnosis not present

## 2020-03-19 DIAGNOSIS — M792 Neuralgia and neuritis, unspecified: Secondary | ICD-10-CM | POA: Diagnosis not present

## 2020-03-19 DIAGNOSIS — M48062 Spinal stenosis, lumbar region with neurogenic claudication: Secondary | ICD-10-CM | POA: Diagnosis not present

## 2020-03-19 DIAGNOSIS — G894 Chronic pain syndrome: Secondary | ICD-10-CM | POA: Diagnosis not present

## 2020-03-19 DIAGNOSIS — G580 Intercostal neuropathy: Secondary | ICD-10-CM | POA: Diagnosis not present

## 2020-03-19 DIAGNOSIS — M25559 Pain in unspecified hip: Secondary | ICD-10-CM | POA: Diagnosis not present

## 2020-03-19 DIAGNOSIS — R5382 Chronic fatigue, unspecified: Secondary | ICD-10-CM | POA: Diagnosis not present

## 2020-03-19 DIAGNOSIS — M25519 Pain in unspecified shoulder: Secondary | ICD-10-CM | POA: Diagnosis not present

## 2020-03-19 DIAGNOSIS — M9931 Osseous stenosis of neural canal of cervical region: Secondary | ICD-10-CM | POA: Diagnosis not present

## 2020-03-19 DIAGNOSIS — M9951 Intervertebral disc stenosis of neural canal of cervical region: Secondary | ICD-10-CM | POA: Diagnosis not present

## 2020-03-19 DIAGNOSIS — M79609 Pain in unspecified limb: Secondary | ICD-10-CM | POA: Diagnosis not present
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
## 2020-04-04 ENCOUNTER — Ambulatory Visit: Payer: Medicare Other | Admitting: Family Medicine

## 2020-04-04 ENCOUNTER — Encounter: Payer: Self-pay | Admitting: Family Medicine

## 2020-04-04 ENCOUNTER — Other Ambulatory Visit: Payer: Self-pay

## 2020-04-04 VITALS — BP 150/82 | HR 88 | Ht 66.0 in | Wt 202.0 lb

## 2020-04-04 DIAGNOSIS — M2550 Pain in unspecified joint: Secondary | ICD-10-CM | POA: Diagnosis not present

## 2020-04-04 DIAGNOSIS — F339 Major depressive disorder, recurrent, unspecified: Secondary | ICD-10-CM | POA: Insufficient documentation

## 2020-04-04 DIAGNOSIS — K219 Gastro-esophageal reflux disease without esophagitis: Secondary | ICD-10-CM | POA: Insufficient documentation

## 2020-04-04 DIAGNOSIS — I2510 Atherosclerotic heart disease of native coronary artery without angina pectoris: Secondary | ICD-10-CM | POA: Insufficient documentation

## 2020-04-04 DIAGNOSIS — F1011 Alcohol abuse, in remission: Secondary | ICD-10-CM | POA: Insufficient documentation

## 2020-04-04 DIAGNOSIS — E7800 Pure hypercholesterolemia, unspecified: Secondary | ICD-10-CM | POA: Insufficient documentation

## 2020-04-04 LAB — COMPREHENSIVE METABOLIC PANEL
ALT: 26 U/L (ref 0–35)
AST: 24 U/L (ref 0–37)
Albumin: 4.5 g/dL (ref 3.5–5.2)
Alkaline Phosphatase: 84 U/L (ref 39–117)
BUN: 13 mg/dL (ref 6–23)
CO2: 29 mEq/L (ref 19–32)
Calcium: 8.9 mg/dL (ref 8.4–10.5)
Chloride: 105 mEq/L (ref 96–112)
Creatinine, Ser: 0.84 mg/dL (ref 0.40–1.20)
GFR: 74.87 mL/min (ref >60.00)
Glucose, Bld: 90 mg/dL (ref 70–99)
Potassium: 4 mEq/L (ref 3.5–5.1)
Sodium: 141 mEq/L (ref 135–145)
Total Bilirubin: 0.5 mg/dL (ref 0.2–1.2)
Total Protein: 6.9 g/dL (ref 6.0–8.3)

## 2020-04-04 LAB — CBC WITH DIFFERENTIAL/PLATELET
Basophils Absolute: 0 10*3/uL (ref 0.0–0.1)
Basophils Relative: 0.6 % (ref 0.0–3.0)
Eosinophils Absolute: 0.2 10*3/uL (ref 0.0–0.7)
Eosinophils Relative: 3.9 % (ref 0.0–5.0)
HCT: 39.8 % (ref 36.0–46.0)
Hemoglobin: 13.5 g/dL (ref 12.0–15.0)
Lymphocytes Relative: 47.9 % — ABNORMAL HIGH (ref 12.0–46.0)
Lymphs Abs: 2.5 10*3/uL (ref 0.7–4.0)
MCHC: 33.8 g/dL (ref 30.0–36.0)
MCV: 90.4 fl (ref 78.0–100.0)
Monocytes Absolute: 0.6 10*3/uL (ref 0.1–1.0)
Monocytes Relative: 11.6 % (ref 3.0–12.0)
Neutro Abs: 1.9 10*3/uL (ref 1.4–7.7)
Neutrophils Relative %: 36 % — ABNORMAL LOW (ref 43.0–77.0)
Platelets: 259 10*3/uL (ref 150.0–400.0)
RBC: 4.4 Mil/uL (ref 3.87–5.11)
RDW: 13 % (ref 11.5–15.5)
WBC: 5.3 10*3/uL (ref 4.0–10.5)

## 2020-04-04 LAB — HEPATIC FUNCTION PANEL
ALT: 26 U/L (ref 0–35)
AST: 24 U/L (ref 0–37)
Albumin: 4.5 g/dL (ref 3.5–5.2)
Alkaline Phosphatase: 84 U/L (ref 39–117)
Bilirubin, Direct: 0.1 mg/dL (ref 0.0–0.3)
Total Bilirubin: 0.5 mg/dL (ref 0.2–1.2)
Total Protein: 6.9 g/dL (ref 6.0–8.3)

## 2020-04-04 LAB — SEDIMENTATION RATE: Sed Rate: 4 mm/hr (ref 0–30)

## 2020-04-04 LAB — IBC PANEL
Iron: 104 ug/dL (ref 42–145)
Saturation Ratios: 26.6 % (ref 20.0–50.0)
Transferrin: 279 mg/dL (ref 212.0–360.0)

## 2020-04-04 LAB — TSH: TSH: 9.53 u[IU]/mL — ABNORMAL HIGH (ref 0.35–4.50)

## 2020-04-04 LAB — VITAMIN D 25 HYDROXY (VIT D DEFICIENCY, FRACTURES): VITD: 23.91 ng/mL — ABNORMAL LOW (ref 30.00–100.00)

## 2020-04-04 LAB — URIC ACID: Uric Acid, Serum: 6.4 mg/dL (ref 2.4–7.0)

## 2020-04-04 LAB — VITAMIN B12: Vitamin B-12: 444 pg/mL (ref 211–911)

## 2020-04-04 LAB — C-REACTIVE PROTEIN: CRP: <1 mg/dL (ref 0.5–20.0)

## 2020-04-04 LAB — FERRITIN: Ferritin: 64.5 ng/mL (ref 10.0–291.0)

## 2020-04-04 NOTE — Patient Instructions (Addendum)
Good to see you Labs today Turmeric 500mg  daily  Tart cherry extract 1200mg  at night Vitamin D 2000 IU daily  See me again in 5-6 weeks

## 2020-04-04 NOTE — Assessment & Plan Note (Signed)
Patient has significant arthritic changes of multiple joints.  Patient though has had chronic pain for quite some time.  Patient has been on multiple different kind of medications but is not making significant improvement.  I would like to get laboratory work-up to rule out other things such as autoimmune disease that could be contributing also uric acid.  Discussed with patient about icing regimen discussed potential topical anti-inflammatories as well.  No new prescription medications given today.  We will see what the laboratory work-up shows and see if any changes in medical management.  Follow-up with me again in 5 to 6 weeks

## 2020-04-09 ENCOUNTER — Encounter: Payer: Self-pay | Admitting: Family Medicine

## 2020-04-09 LAB — CYCLIC CITRUL PEPTIDE ANTIBODY, IGG: Cyclic Citrullin Peptide Ab: <16 UNITS

## 2020-04-09 LAB — RHEUMATOID FACTOR: Rhuematoid fact SerPl-aCnc: <14 IU/mL (ref <14)

## 2020-04-09 LAB — PTH, INTACT AND CALCIUM
Calcium: 8.9 mg/dL (ref 8.6–10.4)
PTH: 18 pg/mL (ref 16–77)

## 2020-04-09 LAB — CALCIUM, IONIZED: Calcium, Ion: 4.73 mg/dL — ABNORMAL LOW (ref 4.8–5.6)

## 2020-04-09 LAB — ANA: Anti Nuclear Antibody (ANA): NEGATIVE

## 2020-04-09 LAB — ANGIOTENSIN CONVERTING ENZYME: Angiotensin-Converting Enzyme: 37 U/L (ref 9–67)

## 2020-04-16 DIAGNOSIS — G580 Intercostal neuropathy: Secondary | ICD-10-CM | POA: Diagnosis not present

## 2020-04-16 DIAGNOSIS — M5030 Other cervical disc degeneration, unspecified cervical region: Secondary | ICD-10-CM | POA: Diagnosis not present

## 2020-04-16 DIAGNOSIS — M5136 Other intervertebral disc degeneration, lumbar region: Secondary | ICD-10-CM | POA: Diagnosis not present

## 2020-04-16 DIAGNOSIS — G894 Chronic pain syndrome: Secondary | ICD-10-CM | POA: Diagnosis not present

## 2020-05-02 DIAGNOSIS — H524 Presbyopia: Secondary | ICD-10-CM | POA: Diagnosis not present

## 2020-05-02 DIAGNOSIS — D3132 Benign neoplasm of left choroid: Secondary | ICD-10-CM | POA: Diagnosis not present

## 2020-05-02 DIAGNOSIS — H2513 Age-related nuclear cataract, bilateral: Secondary | ICD-10-CM | POA: Diagnosis not present

## 2020-05-02 DIAGNOSIS — H35363 Drusen (degenerative) of macula, bilateral: Secondary | ICD-10-CM | POA: Diagnosis not present

## 2020-05-02 DIAGNOSIS — H25013 Cortical age-related cataract, bilateral: Secondary | ICD-10-CM | POA: Diagnosis not present

## 2020-05-09 ENCOUNTER — Ambulatory Visit: Payer: Medicare Other | Admitting: Family Medicine

## 2020-05-09 DIAGNOSIS — M5136 Other intervertebral disc degeneration, lumbar region: Secondary | ICD-10-CM | POA: Diagnosis not present

## 2020-05-09 DIAGNOSIS — G580 Intercostal neuropathy: Secondary | ICD-10-CM | POA: Diagnosis not present

## 2020-05-09 DIAGNOSIS — M5030 Other cervical disc degeneration, unspecified cervical region: Secondary | ICD-10-CM | POA: Diagnosis not present

## 2020-05-09 DIAGNOSIS — G894 Chronic pain syndrome: Secondary | ICD-10-CM | POA: Diagnosis not present

## 2020-05-30 NOTE — Progress Notes (Signed)
[place] Phone: [phone] Subjective:   Fontaine No, am serving as a scribe for Dr. Hulan Saas. This visit occurred during the SARS-CoV-2 public health emergency.  Safety protocols were in place, including screening questions prior to the visit, additional usage of staff PPE, and extensive cleaning of exam room while observing appropriate contact time as indicated for disinfecting solutions.  I'm seeing this patient by the request  of:  Redmon, Noelle, PA  CC: Multiple complaints follow-up  [id number]   04/04/2020 Patient has significant arthritic changes of multiple joints.  Patient though has had chronic pain for quite some time.  Patient has been on multiple different kind of medications but is not making significant improvement.  I would like to get laboratory work-up to rule out other things such as autoimmune disease that could be contributing also uric acid.  Discussed with patient about icing regimen discussed potential topical anti-inflammatories as well.  No new prescription medications given today.  We will see what the laboratory work-up shows and see if any changes in medical management.  Follow-up with me again in 5 to 6 weeks  Update 05/31/2020 TWYLA DAIS is a 60 y.o. female coming in with complaint of cervical spine and R knee pain. Recommended Vit D supplementation and speaking with PCP about Synthroid due to elevated thyroid lab values. Patient states that she continues to have neck pain. Uses Tylenol during the day.  Patient states that she has been taking the Synthroid medicine since she did have a TSH level of   Lab Results  Component Value Date   TSH 9.53 (H) 04/04/2020   Has not noticed any improvement with her pain yet.   R knee pain persists and with no change since last visit.  No new arthritic pain but feels like it is her chronic problems.  Is taking Synthroid since last visit.  PCP wanted to check labs.     Patient does take pain medication from other provider.  Past Medical History:  Diagnosis Date  . Anxiety   . Chronic low back pain   . Difficult intubation    pt states that she has TMJ and it is difficult to open her mouth wide.  . Dyslipidemia   . Fasciculations of muscle 04/20/2013  . GERD (gastroesophageal reflux disease)   . Hiatal hernia   . Hypertension   . Migraine   . Muscle cramping 04/20/2013  . PONV (postoperative nausea and vomiting)   . TMJ dysfunction    Past Surgical History:  Procedure Laterality Date  . CHOLECYSTECTOMY    . LAPAROSCOPIC HYSTERECTOMY    . TONSILLECTOMY     Social History   Socioeconomic History  . Marital status: Single    Spouse name: Not on file  . Number of children: 0  . Years of education: BA  . Highest education level: Not on file  Occupational History  . Occupation: Product manager  . Occupation: caregiver  Tobacco Use  . Smoking status: Former Research scientist (life sciences)  . Smokeless tobacco: Never Used  Substance and Sexual Activity  . Alcohol use: No    Comment: Quit 2011  . Drug use: No  . Sexual activity: Not on file  Other Topics Concern  . Not on file  Social History Narrative  . Not on file   Social Determinants of Health   Financial Resource Strain: Not on file  Food Insecurity: Not on file  Transportation Needs: Not  on file  Physical Activity: Not on file  Stress: Not on file  Social Connections: Not on file   Allergies  Allergen Reactions  . Penicillins     Unknown reaction   Family History  Problem Relation Age of Onset  . Stroke Father   . COPD Sister        Current Outpatient Medications (Analgesics):  .  oxyCODONE-acetaminophen (ROXICET) 5-325 MG tablet, Take 1-2 tablets by mouth every 4 (four) hours as needed for moderate pain or severe pain.   Current Outpatient Medications (Other):  .  esomeprazole (NEXIUM) 40 MG capsule, Take 40 mg by mouth daily at 12 noon.   Reviewed prior  external information including notes and imaging from  primary care provider As well as notes that were available from care everywhere and other healthcare systems.  Past medical history, social, surgical and family history all reviewed in electronic medical record.  No pertanent information unless stated regarding to the chief complaint.   Review of Systems:  No headache, visual changes, nausea, vomiting, diarrhea, constipation, dizziness, abdominal pain, skin rash, fevers, chills, night sweats, weight loss, swollen lymph nodes, chest pain, shortness of breath, mood changes. POSITIVE muscle aches, body aches, joint swelling  Objective  Blood pressure 128/84, pulse 100, height 5\' 6"  (1.676 m), weight 199 lb (90.3 kg), SpO2 96 %.   General: No apparent distress alert and oriented x3 mood and affect normal, dressed appropriately.  HEENT: Pupils equal, extraocular movements intact  Respiratory: Patient's speak in full sentences and does not appear short of breath  Cardiovascular: No lower extremity edema, non tender, no erythema  Gait antalgic Continues to have pain in multiple different areas.  Arthritic changes of the knees noted.  Loss of lordosis of the neck and the back.  4+ out of 5 strength of all the extremities.  Seems to be neurovascularly intact though with decent grip strength    Impression and Recommendations:     The above documentation has been reviewed and is accurate and complete Lyndal Pulley, DO

## 2020-05-31 ENCOUNTER — Ambulatory Visit: Payer: Medicare Other | Admitting: Family Medicine

## 2020-05-31 ENCOUNTER — Other Ambulatory Visit: Payer: Self-pay

## 2020-05-31 ENCOUNTER — Encounter: Payer: Self-pay | Admitting: Family Medicine

## 2020-05-31 VITALS — BP 128/84 | HR 100 | Ht 66.0 in | Wt 199.0 lb

## 2020-05-31 DIAGNOSIS — M2550 Pain in unspecified joint: Secondary | ICD-10-CM

## 2020-05-31 LAB — TSH: TSH: 1.31 u[IU]/mL (ref 0.35–4.50)

## 2020-05-31 LAB — T4, FREE: Free T4: 0.75 ng/dL (ref 0.60–1.60)

## 2020-05-31 LAB — VITAMIN D 25 HYDROXY (VIT D DEFICIENCY, FRACTURES): VITD: 26.44 ng/mL — ABNORMAL LOW (ref 30.00–100.00)

## 2020-05-31 LAB — T3, FREE: T3, Free: 3.4 pg/mL (ref 2.3–4.2)

## 2020-05-31 NOTE — Patient Instructions (Signed)
Good to see you Labs today Will write you on Mychart and we will change accordingly Continue everything else lets give it a few months See me again in 2 months
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
## 2020-06-11 DIAGNOSIS — G894 Chronic pain syndrome: Secondary | ICD-10-CM | POA: Diagnosis not present

## 2020-06-11 DIAGNOSIS — M5030 Other cervical disc degeneration, unspecified cervical region: Secondary | ICD-10-CM | POA: Diagnosis not present

## 2020-06-11 DIAGNOSIS — M179 Osteoarthritis of knee, unspecified: Secondary | ICD-10-CM | POA: Diagnosis not present

## 2020-06-11 DIAGNOSIS — M5136 Other intervertebral disc degeneration, lumbar region: Secondary | ICD-10-CM | POA: Diagnosis not present

## 2020-07-17 DIAGNOSIS — M792 Neuralgia and neuritis, unspecified: Secondary | ICD-10-CM | POA: Diagnosis not present

## 2020-07-17 DIAGNOSIS — R5382 Chronic fatigue, unspecified: Secondary | ICD-10-CM | POA: Diagnosis not present

## 2020-07-17 DIAGNOSIS — M48062 Spinal stenosis, lumbar region with neurogenic claudication: Secondary | ICD-10-CM | POA: Diagnosis not present

## 2020-07-17 DIAGNOSIS — M9951 Intervertebral disc stenosis of neural canal of cervical region: Secondary | ICD-10-CM | POA: Diagnosis not present

## 2020-07-17 DIAGNOSIS — M5136 Other intervertebral disc degeneration, lumbar region: Secondary | ICD-10-CM | POA: Diagnosis not present

## 2020-07-17 DIAGNOSIS — G894 Chronic pain syndrome: Secondary | ICD-10-CM | POA: Diagnosis not present

## 2020-07-17 DIAGNOSIS — M4807 Spinal stenosis, lumbosacral region: Secondary | ICD-10-CM | POA: Diagnosis not present

## 2020-07-17 DIAGNOSIS — M5030 Other cervical disc degeneration, unspecified cervical region: Secondary | ICD-10-CM | POA: Diagnosis not present

## 2020-07-17 DIAGNOSIS — M25559 Pain in unspecified hip: Secondary | ICD-10-CM | POA: Diagnosis not present

## 2020-07-17 DIAGNOSIS — M47897 Other spondylosis, lumbosacral region: Secondary | ICD-10-CM | POA: Diagnosis not present

## 2020-07-17 DIAGNOSIS — G8929 Other chronic pain: Secondary | ICD-10-CM | POA: Diagnosis not present

## 2020-07-17 DIAGNOSIS — M79609 Pain in unspecified limb: Secondary | ICD-10-CM | POA: Diagnosis not present

## 2020-07-17 DIAGNOSIS — M9941 Connective tissue stenosis of neural canal of cervical region: Secondary | ICD-10-CM | POA: Diagnosis not present

## 2020-07-17 DIAGNOSIS — M179 Osteoarthritis of knee, unspecified: Secondary | ICD-10-CM | POA: Diagnosis not present

## 2020-07-17 DIAGNOSIS — M4726 Other spondylosis with radiculopathy, lumbar region: Secondary | ICD-10-CM | POA: Diagnosis not present

## 2020-07-17 DIAGNOSIS — M25519 Pain in unspecified shoulder: Secondary | ICD-10-CM | POA: Diagnosis not present

## 2020-08-14 DIAGNOSIS — M5030 Other cervical disc degeneration, unspecified cervical region: Secondary | ICD-10-CM | POA: Diagnosis not present

## 2020-08-14 DIAGNOSIS — M5136 Other intervertebral disc degeneration, lumbar region: Secondary | ICD-10-CM | POA: Diagnosis not present

## 2020-08-14 DIAGNOSIS — M259 Joint disorder, unspecified: Secondary | ICD-10-CM | POA: Diagnosis not present

## 2020-08-14 DIAGNOSIS — G894 Chronic pain syndrome: Secondary | ICD-10-CM | POA: Diagnosis not present

## 2020-08-21 ENCOUNTER — Ambulatory Visit: Payer: Medicare Other | Admitting: Family Medicine

## 2020-09-02 DIAGNOSIS — Z1231 Encounter for screening mammogram for malignant neoplasm of breast: Secondary | ICD-10-CM | POA: Diagnosis not present

## 2020-09-04 DIAGNOSIS — L299 Pruritus, unspecified: Secondary | ICD-10-CM | POA: Diagnosis not present

## 2020-09-10 DIAGNOSIS — M5030 Other cervical disc degeneration, unspecified cervical region: Secondary | ICD-10-CM | POA: Diagnosis not present

## 2020-09-10 DIAGNOSIS — G894 Chronic pain syndrome: Secondary | ICD-10-CM | POA: Diagnosis not present

## 2020-09-10 DIAGNOSIS — M5136 Other intervertebral disc degeneration, lumbar region: Secondary | ICD-10-CM | POA: Diagnosis not present

## 2020-09-10 DIAGNOSIS — M179 Osteoarthritis of knee, unspecified: Secondary | ICD-10-CM | POA: Diagnosis not present

## 2020-09-18 DIAGNOSIS — M170 Bilateral primary osteoarthritis of knee: Secondary | ICD-10-CM | POA: Diagnosis not present

## 2020-09-18 DIAGNOSIS — M25512 Pain in left shoulder: Secondary | ICD-10-CM | POA: Diagnosis not present

## 2020-09-18 DIAGNOSIS — M1612 Unilateral primary osteoarthritis, left hip: Secondary | ICD-10-CM | POA: Diagnosis not present

## 2020-10-07 DIAGNOSIS — M259 Joint disorder, unspecified: Secondary | ICD-10-CM | POA: Diagnosis not present

## 2020-10-07 DIAGNOSIS — G894 Chronic pain syndrome: Secondary | ICD-10-CM | POA: Diagnosis not present

## 2020-10-07 DIAGNOSIS — M5136 Other intervertebral disc degeneration, lumbar region: Secondary | ICD-10-CM | POA: Diagnosis not present

## 2020-10-07 DIAGNOSIS — M5030 Other cervical disc degeneration, unspecified cervical region: Secondary | ICD-10-CM | POA: Diagnosis not present

## 2020-10-15 DIAGNOSIS — Z23 Encounter for immunization: Secondary | ICD-10-CM | POA: Diagnosis not present

## 2020-10-15 DIAGNOSIS — E039 Hypothyroidism, unspecified: Secondary | ICD-10-CM | POA: Diagnosis not present

## 2020-10-15 DIAGNOSIS — K219 Gastro-esophageal reflux disease without esophagitis: Secondary | ICD-10-CM | POA: Diagnosis not present

## 2020-10-15 DIAGNOSIS — I10 Essential (primary) hypertension: Secondary | ICD-10-CM | POA: Diagnosis not present

## 2020-10-15 DIAGNOSIS — Z8673 Personal history of transient ischemic attack (TIA), and cerebral infarction without residual deficits: Secondary | ICD-10-CM | POA: Diagnosis not present

## 2020-10-15 DIAGNOSIS — I2510 Atherosclerotic heart disease of native coronary artery without angina pectoris: Secondary | ICD-10-CM | POA: Diagnosis not present

## 2020-10-15 DIAGNOSIS — Z0000 Encounter for general adult medical examination without abnormal findings: Secondary | ICD-10-CM | POA: Diagnosis not present

## 2020-10-15 DIAGNOSIS — E7800 Pure hypercholesterolemia, unspecified: Secondary | ICD-10-CM | POA: Diagnosis not present

## 2020-11-04 DIAGNOSIS — M48062 Spinal stenosis, lumbar region with neurogenic claudication: Secondary | ICD-10-CM | POA: Diagnosis not present

## 2020-11-04 DIAGNOSIS — G8929 Other chronic pain: Secondary | ICD-10-CM | POA: Diagnosis not present

## 2020-11-04 DIAGNOSIS — M47897 Other spondylosis, lumbosacral region: Secondary | ICD-10-CM | POA: Diagnosis not present

## 2020-11-04 DIAGNOSIS — M79609 Pain in unspecified limb: Secondary | ICD-10-CM | POA: Diagnosis not present

## 2020-11-04 DIAGNOSIS — R5382 Chronic fatigue, unspecified: Secondary | ICD-10-CM | POA: Diagnosis not present

## 2020-11-04 DIAGNOSIS — G894 Chronic pain syndrome: Secondary | ICD-10-CM | POA: Diagnosis not present

## 2020-11-04 DIAGNOSIS — M5030 Other cervical disc degeneration, unspecified cervical region: Secondary | ICD-10-CM | POA: Diagnosis not present

## 2020-11-04 DIAGNOSIS — M9951 Intervertebral disc stenosis of neural canal of cervical region: Secondary | ICD-10-CM | POA: Diagnosis not present

## 2020-11-04 DIAGNOSIS — M5136 Other intervertebral disc degeneration, lumbar region: Secondary | ICD-10-CM | POA: Diagnosis not present

## 2020-11-04 DIAGNOSIS — M25519 Pain in unspecified shoulder: Secondary | ICD-10-CM | POA: Diagnosis not present

## 2020-11-04 DIAGNOSIS — M259 Joint disorder, unspecified: Secondary | ICD-10-CM | POA: Diagnosis not present

## 2020-12-02 DIAGNOSIS — M5136 Other intervertebral disc degeneration, lumbar region: Secondary | ICD-10-CM | POA: Diagnosis not present

## 2020-12-02 DIAGNOSIS — G580 Intercostal neuropathy: Secondary | ICD-10-CM | POA: Diagnosis not present

## 2020-12-02 DIAGNOSIS — M5030 Other cervical disc degeneration, unspecified cervical region: Secondary | ICD-10-CM | POA: Diagnosis not present

## 2020-12-02 DIAGNOSIS — G894 Chronic pain syndrome: Secondary | ICD-10-CM | POA: Diagnosis not present

## 2020-12-06 DIAGNOSIS — E039 Hypothyroidism, unspecified: Secondary | ICD-10-CM | POA: Diagnosis not present

## 2020-12-06 DIAGNOSIS — L299 Pruritus, unspecified: Secondary | ICD-10-CM | POA: Diagnosis not present

## 2020-12-06 DIAGNOSIS — Z122 Encounter for screening for malignant neoplasm of respiratory organs: Secondary | ICD-10-CM | POA: Diagnosis not present

## 2020-12-06 DIAGNOSIS — R21 Rash and other nonspecific skin eruption: Secondary | ICD-10-CM | POA: Diagnosis not present

## 2020-12-31 DIAGNOSIS — M5030 Other cervical disc degeneration, unspecified cervical region: Secondary | ICD-10-CM | POA: Diagnosis not present

## 2020-12-31 DIAGNOSIS — M5136 Other intervertebral disc degeneration, lumbar region: Secondary | ICD-10-CM | POA: Diagnosis not present

## 2020-12-31 DIAGNOSIS — G894 Chronic pain syndrome: Secondary | ICD-10-CM | POA: Diagnosis not present

## 2020-12-31 DIAGNOSIS — G580 Intercostal neuropathy: Secondary | ICD-10-CM | POA: Diagnosis not present

## 2021-01-27 DIAGNOSIS — G894 Chronic pain syndrome: Secondary | ICD-10-CM | POA: Diagnosis not present

## 2021-01-27 DIAGNOSIS — M5030 Other cervical disc degeneration, unspecified cervical region: Secondary | ICD-10-CM | POA: Diagnosis not present

## 2021-01-27 DIAGNOSIS — G580 Intercostal neuropathy: Secondary | ICD-10-CM | POA: Diagnosis not present

## 2021-01-27 DIAGNOSIS — M5136 Other intervertebral disc degeneration, lumbar region: Secondary | ICD-10-CM | POA: Diagnosis not present

## 2021-03-04 DIAGNOSIS — G894 Chronic pain syndrome: Secondary | ICD-10-CM | POA: Diagnosis not present

## 2021-03-04 DIAGNOSIS — M25519 Pain in unspecified shoulder: Secondary | ICD-10-CM | POA: Diagnosis not present

## 2021-03-04 DIAGNOSIS — M47897 Other spondylosis, lumbosacral region: Secondary | ICD-10-CM | POA: Diagnosis not present

## 2021-03-04 DIAGNOSIS — M9931 Osseous stenosis of neural canal of cervical region: Secondary | ICD-10-CM | POA: Diagnosis not present

## 2021-03-04 DIAGNOSIS — R5382 Chronic fatigue, unspecified: Secondary | ICD-10-CM | POA: Diagnosis not present

## 2021-03-04 DIAGNOSIS — M5030 Other cervical disc degeneration, unspecified cervical region: Secondary | ICD-10-CM | POA: Diagnosis not present

## 2021-03-04 DIAGNOSIS — M5136 Other intervertebral disc degeneration, lumbar region: Secondary | ICD-10-CM | POA: Diagnosis not present

## 2021-03-04 DIAGNOSIS — M25559 Pain in unspecified hip: Secondary | ICD-10-CM | POA: Diagnosis not present

## 2021-03-04 DIAGNOSIS — M48062 Spinal stenosis, lumbar region with neurogenic claudication: Secondary | ICD-10-CM | POA: Diagnosis not present

## 2021-03-04 DIAGNOSIS — M79609 Pain in unspecified limb: Secondary | ICD-10-CM | POA: Diagnosis not present

## 2021-03-04 DIAGNOSIS — G8929 Other chronic pain: Secondary | ICD-10-CM | POA: Diagnosis not present

## 2021-03-04 DIAGNOSIS — G580 Intercostal neuropathy: Secondary | ICD-10-CM | POA: Diagnosis not present

## 2021-04-01 DIAGNOSIS — M5030 Other cervical disc degeneration, unspecified cervical region: Secondary | ICD-10-CM | POA: Diagnosis not present

## 2021-04-01 DIAGNOSIS — G580 Intercostal neuropathy: Secondary | ICD-10-CM | POA: Diagnosis not present

## 2021-04-01 DIAGNOSIS — G894 Chronic pain syndrome: Secondary | ICD-10-CM | POA: Diagnosis not present

## 2021-04-01 DIAGNOSIS — M5136 Other intervertebral disc degeneration, lumbar region: Secondary | ICD-10-CM | POA: Diagnosis not present

## 2021-04-28 DIAGNOSIS — M5030 Other cervical disc degeneration, unspecified cervical region: Secondary | ICD-10-CM | POA: Diagnosis not present

## 2021-04-28 DIAGNOSIS — M5136 Other intervertebral disc degeneration, lumbar region: Secondary | ICD-10-CM | POA: Diagnosis not present

## 2021-04-28 DIAGNOSIS — G580 Intercostal neuropathy: Secondary | ICD-10-CM | POA: Diagnosis not present

## 2021-04-28 DIAGNOSIS — G894 Chronic pain syndrome: Secondary | ICD-10-CM | POA: Diagnosis not present

## 2021-06-03 DIAGNOSIS — M5030 Other cervical disc degeneration, unspecified cervical region: Secondary | ICD-10-CM | POA: Diagnosis not present

## 2021-06-03 DIAGNOSIS — G580 Intercostal neuropathy: Secondary | ICD-10-CM | POA: Diagnosis not present

## 2021-06-03 DIAGNOSIS — M5136 Other intervertebral disc degeneration, lumbar region: Secondary | ICD-10-CM | POA: Diagnosis not present

## 2021-06-03 DIAGNOSIS — G894 Chronic pain syndrome: Secondary | ICD-10-CM | POA: Diagnosis not present

## 2021-06-25 DIAGNOSIS — M1811 Unilateral primary osteoarthritis of first carpometacarpal joint, right hand: Secondary | ICD-10-CM | POA: Diagnosis not present

## 2021-06-25 DIAGNOSIS — M13849 Other specified arthritis, unspecified hand: Secondary | ICD-10-CM | POA: Diagnosis not present

## 2021-06-25 DIAGNOSIS — M79641 Pain in right hand: Secondary | ICD-10-CM | POA: Diagnosis not present

## 2021-06-25 DIAGNOSIS — M79644 Pain in right finger(s): Secondary | ICD-10-CM | POA: Diagnosis not present

## 2021-07-02 DIAGNOSIS — G8929 Other chronic pain: Secondary | ICD-10-CM | POA: Diagnosis not present

## 2021-07-02 DIAGNOSIS — R5382 Chronic fatigue, unspecified: Secondary | ICD-10-CM | POA: Diagnosis not present

## 2021-07-02 DIAGNOSIS — M47897 Other spondylosis, lumbosacral region: Secondary | ICD-10-CM | POA: Diagnosis not present

## 2021-07-02 DIAGNOSIS — M9951 Intervertebral disc stenosis of neural canal of cervical region: Secondary | ICD-10-CM | POA: Diagnosis not present

## 2021-07-02 DIAGNOSIS — M48062 Spinal stenosis, lumbar region with neurogenic claudication: Secondary | ICD-10-CM | POA: Diagnosis not present

## 2021-07-02 DIAGNOSIS — G894 Chronic pain syndrome: Secondary | ICD-10-CM | POA: Diagnosis not present

## 2021-07-02 DIAGNOSIS — M5136 Other intervertebral disc degeneration, lumbar region: Secondary | ICD-10-CM | POA: Diagnosis not present

## 2021-07-02 DIAGNOSIS — M9931 Osseous stenosis of neural canal of cervical region: Secondary | ICD-10-CM | POA: Diagnosis not present

## 2021-07-02 DIAGNOSIS — M79609 Pain in unspecified limb: Secondary | ICD-10-CM | POA: Diagnosis not present

## 2021-07-02 DIAGNOSIS — M25519 Pain in unspecified shoulder: Secondary | ICD-10-CM | POA: Diagnosis not present

## 2021-07-02 DIAGNOSIS — M5030 Other cervical disc degeneration, unspecified cervical region: Secondary | ICD-10-CM | POA: Diagnosis not present

## 2021-07-02 DIAGNOSIS — G580 Intercostal neuropathy: Secondary | ICD-10-CM | POA: Diagnosis not present

## 2021-07-02 DIAGNOSIS — M25559 Pain in unspecified hip: Secondary | ICD-10-CM | POA: Diagnosis not present

## 2021-07-02 DIAGNOSIS — M792 Neuralgia and neuritis, unspecified: Secondary | ICD-10-CM | POA: Diagnosis not present

## 2021-07-03 DIAGNOSIS — H35363 Drusen (degenerative) of macula, bilateral: Secondary | ICD-10-CM | POA: Diagnosis not present

## 2021-07-03 DIAGNOSIS — G43B0 Ophthalmoplegic migraine, not intractable: Secondary | ICD-10-CM | POA: Diagnosis not present

## 2021-07-03 DIAGNOSIS — H04123 Dry eye syndrome of bilateral lacrimal glands: Secondary | ICD-10-CM | POA: Diagnosis not present

## 2021-07-03 DIAGNOSIS — D3132 Benign neoplasm of left choroid: Secondary | ICD-10-CM | POA: Diagnosis not present

## 2021-07-03 DIAGNOSIS — H25813 Combined forms of age-related cataract, bilateral: Secondary | ICD-10-CM | POA: Diagnosis not present

## 2021-07-30 DIAGNOSIS — M5136 Other intervertebral disc degeneration, lumbar region: Secondary | ICD-10-CM | POA: Diagnosis not present

## 2021-07-30 DIAGNOSIS — M259 Joint disorder, unspecified: Secondary | ICD-10-CM | POA: Diagnosis not present

## 2021-07-30 DIAGNOSIS — G894 Chronic pain syndrome: Secondary | ICD-10-CM | POA: Diagnosis not present

## 2021-07-30 DIAGNOSIS — M5030 Other cervical disc degeneration, unspecified cervical region: Secondary | ICD-10-CM | POA: Diagnosis not present

## 2021-08-26 DIAGNOSIS — G894 Chronic pain syndrome: Secondary | ICD-10-CM | POA: Diagnosis not present

## 2021-08-26 DIAGNOSIS — M259 Joint disorder, unspecified: Secondary | ICD-10-CM | POA: Diagnosis not present

## 2021-08-26 DIAGNOSIS — M5030 Other cervical disc degeneration, unspecified cervical region: Secondary | ICD-10-CM | POA: Diagnosis not present

## 2021-08-26 DIAGNOSIS — M5136 Other intervertebral disc degeneration, lumbar region: Secondary | ICD-10-CM | POA: Diagnosis not present

## 2021-09-12 DIAGNOSIS — B029 Zoster without complications: Secondary | ICD-10-CM | POA: Diagnosis not present

## 2021-09-23 DIAGNOSIS — M5030 Other cervical disc degeneration, unspecified cervical region: Secondary | ICD-10-CM | POA: Diagnosis not present

## 2021-09-23 DIAGNOSIS — M5136 Other intervertebral disc degeneration, lumbar region: Secondary | ICD-10-CM | POA: Diagnosis not present

## 2021-09-23 DIAGNOSIS — M259 Joint disorder, unspecified: Secondary | ICD-10-CM | POA: Diagnosis not present

## 2021-09-23 DIAGNOSIS — G894 Chronic pain syndrome: Secondary | ICD-10-CM | POA: Diagnosis not present

## 2021-10-20 DIAGNOSIS — K219 Gastro-esophageal reflux disease without esophagitis: Secondary | ICD-10-CM | POA: Diagnosis not present

## 2021-10-20 DIAGNOSIS — E7800 Pure hypercholesterolemia, unspecified: Secondary | ICD-10-CM | POA: Diagnosis not present

## 2021-10-20 DIAGNOSIS — E039 Hypothyroidism, unspecified: Secondary | ICD-10-CM | POA: Diagnosis not present

## 2021-10-20 DIAGNOSIS — Z0000 Encounter for general adult medical examination without abnormal findings: Secondary | ICD-10-CM | POA: Diagnosis not present

## 2021-10-20 DIAGNOSIS — Z23 Encounter for immunization: Secondary | ICD-10-CM | POA: Diagnosis not present

## 2021-10-20 DIAGNOSIS — I2510 Atherosclerotic heart disease of native coronary artery without angina pectoris: Secondary | ICD-10-CM | POA: Diagnosis not present

## 2021-10-20 DIAGNOSIS — I10 Essential (primary) hypertension: Secondary | ICD-10-CM | POA: Diagnosis not present

## 2021-10-20 DIAGNOSIS — R911 Solitary pulmonary nodule: Secondary | ICD-10-CM | POA: Diagnosis not present

## 2021-10-21 DIAGNOSIS — G894 Chronic pain syndrome: Secondary | ICD-10-CM | POA: Diagnosis not present

## 2021-10-21 DIAGNOSIS — M48062 Spinal stenosis, lumbar region with neurogenic claudication: Secondary | ICD-10-CM | POA: Diagnosis not present

## 2021-10-21 DIAGNOSIS — G580 Intercostal neuropathy: Secondary | ICD-10-CM | POA: Diagnosis not present

## 2021-10-21 DIAGNOSIS — M4726 Other spondylosis with radiculopathy, lumbar region: Secondary | ICD-10-CM | POA: Diagnosis not present

## 2021-10-21 DIAGNOSIS — M5136 Other intervertebral disc degeneration, lumbar region: Secondary | ICD-10-CM | POA: Diagnosis not present

## 2021-10-21 DIAGNOSIS — M4322 Fusion of spine, cervical region: Secondary | ICD-10-CM | POA: Diagnosis not present

## 2021-10-21 DIAGNOSIS — M792 Neuralgia and neuritis, unspecified: Secondary | ICD-10-CM | POA: Diagnosis not present

## 2021-10-21 DIAGNOSIS — R5382 Chronic fatigue, unspecified: Secondary | ICD-10-CM | POA: Diagnosis not present

## 2021-10-21 DIAGNOSIS — M79609 Pain in unspecified limb: Secondary | ICD-10-CM | POA: Diagnosis not present

## 2021-10-21 DIAGNOSIS — M9951 Intervertebral disc stenosis of neural canal of cervical region: Secondary | ICD-10-CM | POA: Diagnosis not present

## 2021-10-21 DIAGNOSIS — M5030 Other cervical disc degeneration, unspecified cervical region: Secondary | ICD-10-CM | POA: Diagnosis not present

## 2021-10-21 DIAGNOSIS — M25519 Pain in unspecified shoulder: Secondary | ICD-10-CM | POA: Diagnosis not present

## 2021-10-21 DIAGNOSIS — G8929 Other chronic pain: Secondary | ICD-10-CM | POA: Diagnosis not present

## 2021-10-21 DIAGNOSIS — M9931 Osseous stenosis of neural canal of cervical region: Secondary | ICD-10-CM | POA: Diagnosis not present

## 2021-10-21 DIAGNOSIS — M259 Joint disorder, unspecified: Secondary | ICD-10-CM | POA: Diagnosis not present

## 2021-10-21 DIAGNOSIS — M25559 Pain in unspecified hip: Secondary | ICD-10-CM | POA: Diagnosis not present

## 2021-10-21 DIAGNOSIS — M47897 Other spondylosis, lumbosacral region: Secondary | ICD-10-CM | POA: Diagnosis not present

## 2021-11-07 DIAGNOSIS — M25522 Pain in left elbow: Secondary | ICD-10-CM | POA: Diagnosis not present

## 2021-11-24 DIAGNOSIS — Z1231 Encounter for screening mammogram for malignant neoplasm of breast: Secondary | ICD-10-CM | POA: Diagnosis not present

## 2021-12-17 DIAGNOSIS — M25522 Pain in left elbow: Secondary | ICD-10-CM | POA: Diagnosis not present

## 2021-12-17 DIAGNOSIS — M25512 Pain in left shoulder: Secondary | ICD-10-CM | POA: Diagnosis not present

## 2022-01-07 DIAGNOSIS — M5442 Lumbago with sciatica, left side: Secondary | ICD-10-CM | POA: Diagnosis not present

## 2022-03-03 DIAGNOSIS — M5442 Lumbago with sciatica, left side: Secondary | ICD-10-CM | POA: Diagnosis not present

## 2022-03-03 DIAGNOSIS — M259 Joint disorder, unspecified: Secondary | ICD-10-CM | POA: Diagnosis not present

## 2022-03-03 DIAGNOSIS — G894 Chronic pain syndrome: Secondary | ICD-10-CM | POA: Diagnosis not present

## 2022-03-03 DIAGNOSIS — M179 Osteoarthritis of knee, unspecified: Secondary | ICD-10-CM | POA: Diagnosis not present

## 2022-03-03 DIAGNOSIS — M545 Low back pain, unspecified: Secondary | ICD-10-CM | POA: Diagnosis not present

## 2022-03-03 DIAGNOSIS — M47897 Other spondylosis, lumbosacral region: Secondary | ICD-10-CM | POA: Diagnosis not present

## 2022-03-03 DIAGNOSIS — M961 Postlaminectomy syndrome, not elsewhere classified: Secondary | ICD-10-CM | POA: Diagnosis not present

## 2022-03-03 DIAGNOSIS — M5136 Other intervertebral disc degeneration, lumbar region: Secondary | ICD-10-CM | POA: Diagnosis not present

## 2022-03-31 DIAGNOSIS — M5442 Lumbago with sciatica, left side: Secondary | ICD-10-CM | POA: Diagnosis not present

## 2022-05-05 DIAGNOSIS — G894 Chronic pain syndrome: Secondary | ICD-10-CM | POA: Diagnosis not present

## 2022-05-05 DIAGNOSIS — Z79891 Long term (current) use of opiate analgesic: Secondary | ICD-10-CM | POA: Diagnosis not present

## 2022-05-05 DIAGNOSIS — Z5181 Encounter for therapeutic drug level monitoring: Secondary | ICD-10-CM | POA: Diagnosis not present

## 2022-05-05 DIAGNOSIS — M5442 Lumbago with sciatica, left side: Secondary | ICD-10-CM | POA: Diagnosis not present

## 2022-06-03 DIAGNOSIS — G894 Chronic pain syndrome: Secondary | ICD-10-CM | POA: Diagnosis not present

## 2022-06-03 DIAGNOSIS — M47897 Other spondylosis, lumbosacral region: Secondary | ICD-10-CM | POA: Diagnosis not present

## 2022-06-10 ENCOUNTER — Ambulatory Visit: Payer: Medicare Other | Admitting: Family Medicine

## 2022-06-10 ENCOUNTER — Ambulatory Visit (INDEPENDENT_AMBULATORY_CARE_PROVIDER_SITE_OTHER): Payer: Medicare Other

## 2022-06-10 VITALS — BP 142/80 | HR 92 | Ht 66.0 in | Wt 205.0 lb

## 2022-06-10 DIAGNOSIS — M79605 Pain in left leg: Secondary | ICD-10-CM | POA: Diagnosis not present

## 2022-06-10 DIAGNOSIS — M25552 Pain in left hip: Secondary | ICD-10-CM | POA: Diagnosis not present

## 2022-06-10 DIAGNOSIS — G8929 Other chronic pain: Secondary | ICD-10-CM | POA: Diagnosis not present

## 2022-06-10 DIAGNOSIS — M545 Low back pain, unspecified: Secondary | ICD-10-CM | POA: Diagnosis not present

## 2022-06-10 DIAGNOSIS — M5416 Radiculopathy, lumbar region: Secondary | ICD-10-CM | POA: Diagnosis not present

## 2022-06-10 NOTE — Patient Instructions (Addendum)
Thank you for coming in today.   Please get an Xray today before you leave   You received an injection today. Seek immediate medical attention if the joint becomes red, extremely painful, or is oozing fluid.   I've referred you to Physical Therapy.  Let us know if you don't hear from them in one week.   Check back in 6 weeks   
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
## 2022-06-15 DIAGNOSIS — R739 Hyperglycemia, unspecified: Secondary | ICD-10-CM | POA: Diagnosis not present

## 2022-06-15 DIAGNOSIS — Z1211 Encounter for screening for malignant neoplasm of colon: Secondary | ICD-10-CM | POA: Diagnosis not present

## 2022-06-15 DIAGNOSIS — I700 Atherosclerosis of aorta: Secondary | ICD-10-CM | POA: Diagnosis not present

## 2022-06-15 DIAGNOSIS — E7800 Pure hypercholesterolemia, unspecified: Secondary | ICD-10-CM | POA: Diagnosis not present

## 2022-06-18 NOTE — Progress Notes (Signed)
Lumbar spine x-ray shows arthritis changes

## 2022-06-18 NOTE — Progress Notes (Signed)
No fractures visible on x-ray.  No severe arthritis is present.

## 2022-06-30 ENCOUNTER — Ambulatory Visit: Payer: Medicare Other | Admitting: Physical Therapy

## 2022-06-30 DIAGNOSIS — M47897 Other spondylosis, lumbosacral region: Secondary | ICD-10-CM | POA: Diagnosis not present

## 2022-06-30 DIAGNOSIS — G894 Chronic pain syndrome: Secondary | ICD-10-CM | POA: Diagnosis not present
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
## 2022-07-07 ENCOUNTER — Ambulatory Visit: Payer: Medicare Other | Admitting: Physical Therapy

## 2022-07-07 ENCOUNTER — Encounter: Payer: Self-pay | Admitting: Physical Therapy

## 2022-07-07 DIAGNOSIS — M25512 Pain in left shoulder: Secondary | ICD-10-CM

## 2022-07-07 DIAGNOSIS — R262 Difficulty in walking, not elsewhere classified: Secondary | ICD-10-CM

## 2022-07-07 DIAGNOSIS — M25511 Pain in right shoulder: Secondary | ICD-10-CM | POA: Diagnosis not present

## 2022-07-07 DIAGNOSIS — M6281 Muscle weakness (generalized): Secondary | ICD-10-CM | POA: Diagnosis not present

## 2022-07-07 DIAGNOSIS — G8929 Other chronic pain: Secondary | ICD-10-CM

## 2022-07-07 DIAGNOSIS — M25552 Pain in left hip: Secondary | ICD-10-CM | POA: Diagnosis not present

## 2022-07-16 ENCOUNTER — Encounter: Payer: Self-pay | Admitting: Physical Therapy

## 2022-07-16 ENCOUNTER — Ambulatory Visit: Payer: Medicare Other | Admitting: Physical Therapy

## 2022-07-16 DIAGNOSIS — M25511 Pain in right shoulder: Secondary | ICD-10-CM | POA: Diagnosis not present

## 2022-07-16 DIAGNOSIS — R262 Difficulty in walking, not elsewhere classified: Secondary | ICD-10-CM

## 2022-07-16 DIAGNOSIS — G8929 Other chronic pain: Secondary | ICD-10-CM

## 2022-07-16 DIAGNOSIS — M25512 Pain in left shoulder: Secondary | ICD-10-CM

## 2022-07-16 DIAGNOSIS — M6281 Muscle weakness (generalized): Secondary | ICD-10-CM | POA: Diagnosis not present

## 2022-07-16 DIAGNOSIS — M25552 Pain in left hip: Secondary | ICD-10-CM | POA: Diagnosis not present
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
## 2022-07-21 ENCOUNTER — Encounter: Payer: Self-pay | Admitting: Physical Therapy

## 2022-07-21 ENCOUNTER — Ambulatory Visit: Payer: Medicare Other | Admitting: Physical Therapy

## 2022-07-21 DIAGNOSIS — M25552 Pain in left hip: Secondary | ICD-10-CM

## 2022-07-21 DIAGNOSIS — G8929 Other chronic pain: Secondary | ICD-10-CM

## 2022-07-21 DIAGNOSIS — M25511 Pain in right shoulder: Secondary | ICD-10-CM

## 2022-07-21 DIAGNOSIS — M25512 Pain in left shoulder: Secondary | ICD-10-CM | POA: Diagnosis not present

## 2022-07-21 DIAGNOSIS — M6281 Muscle weakness (generalized): Secondary | ICD-10-CM

## 2022-07-21 DIAGNOSIS — R262 Difficulty in walking, not elsewhere classified: Secondary | ICD-10-CM
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
## 2022-07-22 ENCOUNTER — Ambulatory Visit: Payer: Medicare Other | Admitting: Family Medicine

## 2022-07-22 ENCOUNTER — Other Ambulatory Visit: Payer: Self-pay

## 2022-07-22 VITALS — BP 132/84 | HR 83 | Ht 66.0 in | Wt 202.0 lb

## 2022-07-22 DIAGNOSIS — M25552 Pain in left hip: Secondary | ICD-10-CM

## 2022-07-22 DIAGNOSIS — M25511 Pain in right shoulder: Secondary | ICD-10-CM

## 2022-07-22 DIAGNOSIS — M5416 Radiculopathy, lumbar region: Secondary | ICD-10-CM | POA: Diagnosis not present

## 2022-07-22 DIAGNOSIS — G8929 Other chronic pain: Secondary | ICD-10-CM | POA: Diagnosis not present

## 2022-07-22 DIAGNOSIS — R222 Localized swelling, mass and lump, trunk: Secondary | ICD-10-CM | POA: Diagnosis not present

## 2022-07-22 NOTE — Patient Instructions (Addendum)
Thank you for coming in today.   Continue PT.   Recheck after your last visit with Marcelino Duster (after 8/20).   Lets keep an eye on that bump. If worsening or not improving at the next visit we can look at it more closely typically with CT scan.

## 2022-07-22 NOTE — Progress Notes (Signed)
   Rubin Payor, PhD, LAT, ATC acting as a scribe for [name], MD.  Diana Browning is a 62 y.o. female who presents to [facility] today for 6-wk f/u lumbar radiculopathy and L hip pain. Pt was last seen by Dr. Denyse Amass on 06/10/22 and was given a L GT steroid injection and was referred to PT, completing 3 visits.  Today, pt reports some improvement w/ her L hip pain, but it's still painful. Radicular symptoms coming distally in the L LE has improved.   She c/o R supraclavicular pain that she noticed a few weeks ago. She feels like there is a "lump" in the area.   Dx imaging: 06/10/22 L-spine & L hip XR  Pertinent review of systems: No fevers or chills or weight loss.  Patient does have night sweats but this is a chronic issue for her thought to be due to menopausal symptoms.  No new systemic symptoms.  Relevant historical information: Hypertension and coronary artery disease.   Exam:  BP 132/84   Pulse 83   Ht 5\' 6"  (1.676 m)   Wt 202 lb (91.6 kg)   SpO2 97%   BMI 32.60 kg/m  General: Well Developed, well nourished, and in no acute distress.   MSK: Right shoulder: Subtle subcutaneous mass in the right supraclavicular area.  This is associated with some tenderness around the trapezius muscle.  Normal shoulder motion.  Intact strength.  Left hip normal motion   Lab and Radiology Results  Diagnostic Limited MSK Ultrasound of: Right supraclavicular mass Small subcutaneous solid structure visible superficial to the muscle layer at the supraclavicular area right shoulder. It measures 1.14 x 0.43 cm Impression: Possible supraclavicular lymph node      Assessment and Plan: 62 y.o. female with right supraclavicular mass concerning for lymph node.  This occurs in the absence of systemic signs or symptoms and she has no cancer history.  This is also associated with some pain in the right shoulder thought to be rotator cuff related.  Continue  physical therapy already scheduled for her left hip.  Will add the right shoulder on.  Will reassess in about 1 month after completion of physical therapy.  If the mass is still present or is worsening we will proceed to advanced imaging likely CT scan of the chest with and without contrast.  Will double check with radiology before I order that test.   PDMP not reviewed this encounter. Orders Placed This Encounter  Procedures   [place] LIMITED JOINT SPACE STRUCTURES UP RIGHT(NO LINKED CHARGES)    Order Specific Question:   Reason for Exam (SYMPTOM  OR DIAGNOSIS REQUIRED)    Answer:   right shoulder pain    Order Specific Question:   Preferred imaging location?    Answer:   [facility] referral to Physical Therapy    Referral Priority:   Routine    Referral Type:   Physical Medicine    Referral Reason:   Specialty Services Required    Requested Specialty:   Physical Therapy    Number of Visits Requested:   1   No orders of the defined types were placed in this encounter.    Discussed warning signs or symptoms. Please see discharge instructions. Patient expresses understanding.   The above documentation has been reviewed and is accurate and complete [name], M.D.

## 2022-07-28 ENCOUNTER — Encounter: Payer: Medicare Other | Admitting: Physical Therapy

## 2022-07-28 DIAGNOSIS — G894 Chronic pain syndrome: Secondary | ICD-10-CM | POA: Diagnosis not present

## 2022-07-28 DIAGNOSIS — M47897 Other spondylosis, lumbosacral region: Secondary | ICD-10-CM | POA: Diagnosis not present

## 2022-08-04 ENCOUNTER — Encounter: Payer: Self-pay | Admitting: Physical Therapy

## 2022-08-04 ENCOUNTER — Ambulatory Visit: Payer: Medicare Other | Admitting: Physical Therapy

## 2022-08-04 DIAGNOSIS — G8929 Other chronic pain: Secondary | ICD-10-CM | POA: Diagnosis not present

## 2022-08-04 DIAGNOSIS — R262 Difficulty in walking, not elsewhere classified: Secondary | ICD-10-CM | POA: Diagnosis not present

## 2022-08-04 DIAGNOSIS — M25552 Pain in left hip: Secondary | ICD-10-CM

## 2022-08-04 DIAGNOSIS — M25512 Pain in left shoulder: Secondary | ICD-10-CM | POA: Diagnosis not present

## 2022-08-04 DIAGNOSIS — M25511 Pain in right shoulder: Secondary | ICD-10-CM

## 2022-08-04 DIAGNOSIS — M6281 Muscle weakness (generalized): Secondary | ICD-10-CM | POA: Diagnosis not present

## 2022-08-04 NOTE — Addendum Note (Signed)
Addended by: Tereasa Coop R on: 08/04/2022 01:51 PM   Modules accepted: Orders
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
## 2022-08-11 ENCOUNTER — Encounter: Payer: Medicare Other | Admitting: Physical Therapy

## 2022-08-18 ENCOUNTER — Encounter: Payer: Medicare Other | Admitting: Physical Therapy

## 2022-08-25 ENCOUNTER — Encounter: Payer: Medicare Other | Admitting: Physical Therapy

## 2022-08-25 DIAGNOSIS — G894 Chronic pain syndrome: Secondary | ICD-10-CM | POA: Diagnosis not present

## 2022-08-25 DIAGNOSIS — Z79891 Long term (current) use of opiate analgesic: Secondary | ICD-10-CM | POA: Diagnosis not present

## 2022-08-25 DIAGNOSIS — M47897 Other spondylosis, lumbosacral region: Secondary | ICD-10-CM | POA: Diagnosis not present

## 2022-08-26 DIAGNOSIS — H04123 Dry eye syndrome of bilateral lacrimal glands: Secondary | ICD-10-CM | POA: Diagnosis not present

## 2022-08-26 DIAGNOSIS — H25813 Combined forms of age-related cataract, bilateral: Secondary | ICD-10-CM | POA: Diagnosis not present

## 2022-08-26 DIAGNOSIS — H524 Presbyopia: Secondary | ICD-10-CM | POA: Diagnosis not present

## 2022-08-26 DIAGNOSIS — D3132 Benign neoplasm of left choroid: Secondary | ICD-10-CM | POA: Diagnosis not present

## 2022-08-27 ENCOUNTER — Ambulatory Visit: Payer: Medicare Other | Admitting: Family Medicine

## 2022-08-27 ENCOUNTER — Encounter: Payer: Self-pay | Admitting: Family Medicine

## 2022-08-27 ENCOUNTER — Other Ambulatory Visit: Payer: Self-pay

## 2022-08-27 VITALS — BP 122/82 | HR 83 | Ht 66.0 in | Wt 203.0 lb

## 2022-08-27 DIAGNOSIS — G8929 Other chronic pain: Secondary | ICD-10-CM | POA: Diagnosis not present

## 2022-08-27 DIAGNOSIS — M25561 Pain in right knee: Secondary | ICD-10-CM | POA: Diagnosis not present

## 2022-08-27 DIAGNOSIS — M25552 Pain in left hip: Secondary | ICD-10-CM | POA: Diagnosis not present

## 2022-08-27 DIAGNOSIS — R222 Localized swelling, mass and lump, trunk: Secondary | ICD-10-CM | POA: Diagnosis not present

## 2022-08-27 NOTE — Progress Notes (Signed)
I, [name], CMA acting as a scribe for Diana Graham, MD.  Diana Browning is a 62 y.o. female who presents to [facility] at [facility] today for R supraclavicular mass, L hip pain, and lumbar radiculopathy. Pt was last seen by Dr. Denyse Amass on 07/22/22 and was advised to cont PT, but she only competed 1 additional visit (canceling the other scheduled visits).  Today, pt reports continued lower back and left hip pain. Has missed a few visit of PT but has been provided with HEP which pt reports doing daily. Mass at right clavicle still present, varies in size slightly.  It is not getting any larger.  Has also been working on strengthen the shoulders.   She also notes chronic right knee pain.  She is wondering if a cortisone shot can be done for the knee.  Dx imaging: 06/10/22 L-spine & L hip XR   Pertinent review of systems: No fevers or chills.  No unintentional weight loss or night sweats.  Relevant historical information: Hypertension   Exam:  BP 122/82   Pulse 83   Ht 5\' 6"  (1.676 m)   Wt 203 lb (92.1 kg)   SpO2 97%   BMI 32.77 kg/m  General: Well Developed, well nourished, and in no acute distress.   MSK: Right knee mild effusion normal motion with crepitation.  Tender palpation medial and lateral joint line.  Left hip tender palpation greater trochanter.  Right clavicle area mild fullness supraclavicular area.    Lab and Radiology Results  Procedure: Real-time Ultrasound Guided Injection of right knee joint superior lateral patella space Device: Philips Affiniti 50G/GE Logiq Images permanently stored and available for review in PACS Verbal informed consent obtained.  Discussed risks and benefits of procedure. Warned about infection, bleeding, hyperglycemia damage to structures among others. Patient expresses understanding and agreement Time-out conducted.   Noted no overlying erythema, induration, or other signs of local infection.   Skin prepped in  a sterile fashion.   Local anesthesia: Topical Ethyl chloride.   With sterile technique and under real time ultrasound guidance: 40 mg of Kenalog and 2 mL of Marcaine injected into knee joint. Fluid seen entering the joint capsule.   Completed without difficulty   Pain immediately resolved suggesting accurate placement of the medication.   Advised to call if fevers/chills, erythema, induration, drainage, or persistent bleeding.   Images permanently stored and available for review in the ultrasound unit.  Impression: Technically successful ultrasound guided injection.            Assessment and Plan: 62 y.o. female with right knee pain due to exacerbation of DJD.  She has significant degenerative changes seen on previous old MRI.  Left hip pain thought to be trochanteric bursitis.  Plan for home exercise program.  Could repeat injection in about 3 to 4 weeks.  She will recheck for that.  Right supraclavicular fullness.  Concern for lymphadenopathy.  No change in size over the last month.  No B symptoms.  We discussed potential for advanced imaging such as a CT scan of the chest and after mutual decision making decided to hold off on advanced imaging for now.  Watchful waiting.   PDMP not reviewed this encounter. Orders Placed This Encounter  Procedures   [place] LIMITED JOINT SPACE STRUCTURES LOW RIGHT(NO LINKED CHARGES)    Order Specific Question:   Reason for Exam (SYMPTOM  OR DIAGNOSIS REQUIRED)    Answer:   knee pain  Order Specific Question:   Preferred imaging location?    Answer:   [facility]   No orders of the defined types were placed in this encounter.    Discussed warning signs or symptoms. Please see discharge instructions. Patient expresses understanding.   The above documentation has been reviewed and is accurate and complete Diana Browning, M.D.

## 2022-08-27 NOTE — Patient Instructions (Signed)
Thank you for coming in today.   You received an injection today. Seek immediate medical attention if the joint becomes red, extremely painful, or is oozing fluid.   Check back in about 3 weeks and we can inject your hip.

## 2022-08-28 ENCOUNTER — Encounter: Payer: Self-pay | Admitting: Family Medicine

## 2022-09-14 ENCOUNTER — Telehealth: Payer: Self-pay | Admitting: Family Medicine

## 2022-09-14 NOTE — Telephone Encounter (Signed)
Pt had steroids R knee on 8/22. She called today to schedule another as her knee is really hurting. As this is too early, she wanted to know if there was a plan B for her R knee pain.

## 2022-09-14 NOTE — Telephone Encounter (Signed)
Forwarding to Dr. Corey to advise.  

## 2022-09-16 NOTE — Telephone Encounter (Signed)
Zilretta APPROVED for RIGHT knee OA  PA# [id number] Valid: 09/16/22-09/16/23

## 2022-09-16 NOTE — Telephone Encounter (Signed)
We will work on authorization of Zilretta injections and gel injection for that knee.  I will take a few days to get authorized.  Brandy please start authorization.

## 2022-09-17 ENCOUNTER — Ambulatory Visit: Payer: Medicare Other | Admitting: Family Medicine

## 2022-09-17 ENCOUNTER — Ambulatory Visit (INDEPENDENT_AMBULATORY_CARE_PROVIDER_SITE_OTHER): Payer: Medicare Other

## 2022-09-17 ENCOUNTER — Other Ambulatory Visit: Payer: Self-pay

## 2022-09-17 VITALS — BP 142/88 | HR 110 | Ht 66.0 in | Wt 202.0 lb

## 2022-09-17 DIAGNOSIS — M25561 Pain in right knee: Secondary | ICD-10-CM | POA: Diagnosis not present

## 2022-09-17 DIAGNOSIS — M1711 Unilateral primary osteoarthritis, right knee: Secondary | ICD-10-CM

## 2022-09-17 DIAGNOSIS — G8929 Other chronic pain: Secondary | ICD-10-CM | POA: Diagnosis not present

## 2022-09-17 MED ORDER — TRIAMCINOLONE ACETONIDE 32 MG IX SRER
32.0000 mg | Freq: Once | INTRA_ARTICULAR | Status: AC
Start: 2022-09-17 — End: 2022-09-17
  Administered 2022-09-17: 32 mg via INTRA_ARTICULAR

## 2022-09-17 NOTE — Progress Notes (Signed)
Rubin Payor, PhD, LAT, ATC acting as a scribe for [name], MD.  Diana Browning is a 62 y.o. female who presents to [facility] at [facility] today for cont'd bilat knee, R>L, pain. Pt was last seen by Dr. Denyse Amass on 08/27/22 and was advised to cont HEP for her hip and she was given a R knee steroid injection. Last L GT steroid injection was on 06/10/22.  Today, pt reports both of her knees are very painful. Not much relief from prior R knee steroid injection. She notes she picked a bunch of black walnuts from the ground, which exacerbated her pain.   Dx imaging: 06/10/22 L-spine & L hip XR   Pertinent review of systems: No fevers or chills  Relevant historical information: Hypertension   Exam:  BP (!) 142/88   Pulse (!) 110   Ht 5\' 6"  (1.676 m)   Wt 202 lb (91.6 kg)   SpO2 98%   BMI 32.60 kg/m  General: Well Developed, well nourished, and in no acute distress.   MSK: Right knee mild effusion.  Normal motion with crepitation.    Lab and Radiology Results   Zilretta injection right knee Procedure: Real-time Ultrasound Guided Injection of right knee joint superior lateral patellar space Device: Philips Affiniti 50G Images permanently stored and available for review in PACS Verbal informed consent obtained.  Discussed risks and benefits of procedure. Warned about infection, hyperglycemia bleeding, damage to structures among others. Patient expresses understanding and agreement Time-out conducted.   Noted no overlying erythema, induration, or other signs of local infection.   Skin prepped in a sterile fashion.   Local anesthesia: Topical Ethyl chloride.   With sterile technique and under real time ultrasound guidance: Zilretta 32 mg injected into knee joint. Fluid seen entering the joint capsule.   Completed without difficulty   Advised to call if fevers/chills, erythema, induration, drainage, or persistent bleeding.   Images permanently stored and  available for review in the ultrasound unit.  Impression: Technically successful ultrasound guided injection.  Lot number: [id number]   X-ray images right knee obtained today personally and independently interpreted Mild DJD medial compartment.  No acute fractures are visible. Await formal radiology review    Assessment and Plan: 62 y.o. female with chronic right knee pain with recent exacerbation.  Steroid injections performed recently did not last long enough.  We did get Zilretta authorized a day ago which we can give today.  Gel injections authorization is still in process.  If today's injection does not provide much benefit I would recommend an MRI.  She has more pain than would be expected based on the appearance of her x-ray.  Meniscus tear or radiographically occult insufficiency or subchondral fracture is a possibility.   PDMP not reviewed this encounter. Orders Placed This Encounter  Procedures   [place] LIMITED JOINT SPACE STRUCTURES LOW BILAT(NO LINKED CHARGES)    Order Specific Question:   Reason for Exam (SYMPTOM  OR DIAGNOSIS REQUIRED)    Answer:   bilateral knee pain    Order Specific Question:   Preferred imaging location?    Answer:   [facility] Knee AP/LAT W/[place] Right    Standing Status:   Future    Number of Occurrences:   1    Standing Expiration Date:   09/17/2023    Order Specific Question:   Reason for Exam (SYMPTOM  OR DIAGNOSIS REQUIRED)    Answer:   eval  knee pain    Order Specific Question:   Preferred imaging location?    Answer:   [facility]   Meds ordered this encounter  Medications   Triamcinolone Acetonide (ZILRETTA) intra-articular injection 32 mg     Discussed warning signs or symptoms. Please see discharge instructions. Patient expresses understanding.   The above documentation has been reviewed and is accurate and complete [name], M.D.

## 2022-09-17 NOTE — Patient Instructions (Signed)
Thank you for coming in today.  You received an injection today. Seek immediate medical attention if the joint becomes red, extremely painful, or is oozing fluid.  Please get an Xray today before you leave  

## 2022-09-17 NOTE — Telephone Encounter (Signed)
ZILRETTA for RIGHT knee OA   Primary Insurance: [employer]HC Medicare Adv HMO-POS Co-pay: $20 Co-insurance: 20% Deductible: does not apply  Prior Auth: APPROVED PA# [id number] Valid: 09/16/22-09/16/23    Knee Injection History 08/27/22 - Kenalog RIGHT

## 2022-09-17 NOTE — Telephone Encounter (Signed)
Pt received Zilretta injection for RIGHT knee OA on 09/17/22. Can consider repeat injection on or after 12/11/22

## 2022-09-22 DIAGNOSIS — R202 Paresthesia of skin: Secondary | ICD-10-CM | POA: Diagnosis not present

## 2022-09-22 DIAGNOSIS — G894 Chronic pain syndrome: Secondary | ICD-10-CM | POA: Diagnosis not present

## 2022-09-22 DIAGNOSIS — M47897 Other spondylosis, lumbosacral region: Secondary | ICD-10-CM | POA: Diagnosis not present

## 2022-10-05 NOTE — Progress Notes (Signed)
Right knee x-ray shows a little bit of arthritis underneath the kneecap.

## 2022-10-20 DIAGNOSIS — G894 Chronic pain syndrome: Secondary | ICD-10-CM | POA: Diagnosis not present

## 2022-10-20 DIAGNOSIS — Z79891 Long term (current) use of opiate analgesic: Secondary | ICD-10-CM | POA: Diagnosis not present

## 2022-11-03 DIAGNOSIS — R7303 Prediabetes: Secondary | ICD-10-CM | POA: Diagnosis not present

## 2022-11-03 DIAGNOSIS — Z1211 Encounter for screening for malignant neoplasm of colon: Secondary | ICD-10-CM | POA: Diagnosis not present

## 2022-11-03 DIAGNOSIS — I2510 Atherosclerotic heart disease of native coronary artery without angina pectoris: Secondary | ICD-10-CM | POA: Diagnosis not present

## 2022-11-03 DIAGNOSIS — Z9181 History of falling: Secondary | ICD-10-CM | POA: Diagnosis not present

## 2022-11-03 DIAGNOSIS — Z0000 Encounter for general adult medical examination without abnormal findings: Secondary | ICD-10-CM | POA: Diagnosis not present

## 2022-11-03 DIAGNOSIS — Z23 Encounter for immunization: Secondary | ICD-10-CM | POA: Diagnosis not present

## 2022-11-03 DIAGNOSIS — R7309 Other abnormal glucose: Secondary | ICD-10-CM | POA: Diagnosis not present

## 2022-11-03 DIAGNOSIS — E039 Hypothyroidism, unspecified: Secondary | ICD-10-CM | POA: Diagnosis not present

## 2022-11-03 DIAGNOSIS — I700 Atherosclerosis of aorta: Secondary | ICD-10-CM | POA: Diagnosis not present

## 2022-11-03 DIAGNOSIS — I10 Essential (primary) hypertension: Secondary | ICD-10-CM | POA: Diagnosis not present

## 2022-11-17 DIAGNOSIS — G894 Chronic pain syndrome: Secondary | ICD-10-CM | POA: Diagnosis not present

## 2022-11-17 DIAGNOSIS — M47897 Other spondylosis, lumbosacral region: Secondary | ICD-10-CM | POA: Diagnosis not present

## 2022-11-17 DIAGNOSIS — M961 Postlaminectomy syndrome, not elsewhere classified: Secondary | ICD-10-CM | POA: Diagnosis not present

## 2022-11-24 DIAGNOSIS — Z1211 Encounter for screening for malignant neoplasm of colon: Secondary | ICD-10-CM | POA: Diagnosis not present

## 2022-12-21 DIAGNOSIS — Z79891 Long term (current) use of opiate analgesic: Secondary | ICD-10-CM | POA: Diagnosis not present

## 2022-12-21 DIAGNOSIS — M179 Osteoarthritis of knee, unspecified: Secondary | ICD-10-CM | POA: Diagnosis not present

## 2022-12-21 DIAGNOSIS — G894 Chronic pain syndrome: Secondary | ICD-10-CM | POA: Diagnosis not present

## 2023-01-08 NOTE — Telephone Encounter (Signed)
 A user error has taken place: encounter opened in error, closed for administrative reasons.

## 2023-01-08 NOTE — Telephone Encounter (Signed)
 VOB initiated for 2025 coverage of Zilretta.

## 2023-01-08 NOTE — Telephone Encounter (Signed)
-----   Message from Adron Bene sent at 01/08/2023 11:52 AM EST ----- Regarding: Zilrtta Can you confirm that pt is auth for Zilretta?  Your note says Valid: 09/16/22-09/16/23  Scheduled visit for Monday

## 2023-01-08 NOTE — Progress Notes (Deleted)
   LILLETTE Ileana Collet, PhD, LAT, ATC acting as a scribe for Artist Lloyd, MD.  YASAMAN KOLEK is a 63 y.o. female who presents to [facility] at [facility] today for exacerbation of her R knee pain. Pt was last seen by Dr. Lloyd on 09/17/22 and was given a R knee Zilretta  injection.  Today, pt reports ***  Dx imaging: 09/17/22 R knee XR  10/29/14 R knee MRI  Pertinent review of systems: ***  Relevant historical information: ***   Exam:  There were no vitals taken for this visit. General: Well Developed, well nourished, and in no acute distress.   MSK: ***    Lab and Radiology Results No results found for this or any previous visit (from the past 72 hours). No results found.     Assessment and Plan: 63 y.o. female with ***   PDMP not reviewed this encounter. No orders of the defined types were placed in this encounter.  No orders of the defined types were placed in this encounter.    Discussed warning signs or symptoms. Please see discharge instructions. Patient expresses understanding.   ***

## 2023-01-11 ENCOUNTER — Ambulatory Visit: Payer: Medicare Other | Admitting: Family Medicine

## 2023-01-12 NOTE — Telephone Encounter (Signed)
 [employer] - Prior Authorization NOT required  P[employer] - Prior Authorization REQUIRED

## 2023-01-14 NOTE — Telephone Encounter (Signed)
 Prior Authorization initiated for [facility] via [email or web address] KEY: [id number]

## 2023-01-15 ENCOUNTER — Ambulatory Visit: Payer: Medicare Other | Admitting: Family Medicine

## 2023-01-20 NOTE — Telephone Encounter (Signed)
 Prior Authorization for ZILRETTA  APPROVED PA# [id number] Valid: 01/14/23-01/05/24

## 2023-01-21 NOTE — Progress Notes (Signed)
   I, [name], CMA acting as a scribe for [name], MD.  Diana Browning is a 63 y.o. female who presents to [facility] at [facility] today for exacerbation of her R knee pain. Pt was last seen by Dr. Denyse Amass on 09/17/22 and was given a R knee Zilretta injection.  Today, pt reports 3 days of improvement of sx with Zilretta injection. Sx getting progressively. Denies visible swelling. Pain radiating into the upper and lower leg. Grabbing sensation while ambulating. Wears knee brace daily.   Dx imaging: 09/17/22 R knee XR  10/29/14 R knee MRI  Pertinent review of systems: No fevers or chills  Relevant historical information: History of meniscus tear right knee on MRI 2016.   Exam:  BP 120/82   Pulse 87   Ht 5\' 6"  (1.676 m)   Wt 207 lb (93.9 kg)   SpO2 97%   BMI 33.41 kg/m  General: Well Developed, well nourished, and in no acute distress.   MSK: Right knee normal-appearing normal motion with crepitation.       Assessment and Plan: 63 y.o. female with chronic right knee pain.  She had a Zilretta injection in September which did not work very well.  X-ray in September only showed mild arthritis changes.  She has continued pain and dysfunction.  Plan for MRI of the right knee to further evaluate source of pain and for either surgical or injection planning.  Remaining injection could be hyaluronic acid injections. Consider referral to orthopedic surgery if needed.  PDMP not reviewed this encounter. Orders Placed This Encounter  Procedures   MR KNEE RIGHT WO CONTRAST    Standing Status:   Future    Expiration Date:   02/22/2023    What is the patient's sedation requirement?:   No Sedation    Does the patient have a pacemaker or implanted devices?:   No    Preferred imaging location?:   [facility] (table limit-350lbs)   No orders of the defined types were placed in this encounter.    Discussed warning signs or symptoms. Please see discharge  instructions. Patient expresses understanding.   The above documentation has been reviewed and is accurate and complete [name], M.D.

## 2023-01-22 ENCOUNTER — Encounter: Payer: Self-pay | Admitting: Family Medicine

## 2023-01-22 ENCOUNTER — Ambulatory Visit: Payer: Medicare Other | Admitting: Family Medicine

## 2023-01-22 ENCOUNTER — Other Ambulatory Visit: Payer: Self-pay

## 2023-01-22 VITALS — BP 120/82 | HR 87 | Ht 66.0 in | Wt 207.0 lb

## 2023-01-22 DIAGNOSIS — M25561 Pain in right knee: Secondary | ICD-10-CM | POA: Diagnosis not present

## 2023-01-22 DIAGNOSIS — M1711 Unilateral primary osteoarthritis, right knee: Secondary | ICD-10-CM | POA: Diagnosis not present

## 2023-01-22 DIAGNOSIS — G8929 Other chronic pain: Secondary | ICD-10-CM

## 2023-01-22 NOTE — Patient Instructions (Addendum)
Thank you for coming in today.  You should hear from MRI scheduling within 1 week. If you do not hear please let me know.    Anticipate gel shots or surgery consult following MRI.

## 2023-01-29 ENCOUNTER — Other Ambulatory Visit: Payer: Self-pay

## 2023-02-02 NOTE — Telephone Encounter (Signed)
Holding until needed by patient.

## 2023-02-02 NOTE — Telephone Encounter (Addendum)
Zilretta for RIGHT knee OA   Medical Buy and Bill  Primary Insurance: [facility] Medicare HMO POS Co-pay: $20 Co-insurance: 20% Deductible: does not apply Prior Auth: NOT required  Pharmacy Benefit - [facility]  Primary Insurance: [facility] Medicare Adv / [employer] Co-pay: $335.23 Co-insurance: undisclosed Deductible: undisclosed Prior Auth: APPROVED PA# [id number] Valid: 01/14/23-01/05/24    Knee Injection History 08/27/22 - Kenalog RIGHT 09/17/22 - Zilretta RIGHT

## 2023-03-05 ENCOUNTER — Encounter: Payer: Self-pay | Admitting: Family Medicine

## 2023-07-15 DIAGNOSIS — Z6833 Body mass index (BMI) 33.0-33.9, adult: Secondary | ICD-10-CM | POA: Diagnosis not present

## 2023-07-15 DIAGNOSIS — Z01419 Encounter for gynecological examination (general) (routine) without abnormal findings: Secondary | ICD-10-CM | POA: Diagnosis not present

## 2023-07-15 DIAGNOSIS — Z1231 Encounter for screening mammogram for malignant neoplasm of breast: Secondary | ICD-10-CM | POA: Diagnosis not present

## 2023-08-30 DIAGNOSIS — G43B0 Ophthalmoplegic migraine, not intractable: Secondary | ICD-10-CM | POA: Diagnosis not present

## 2023-08-30 DIAGNOSIS — H25813 Combined forms of age-related cataract, bilateral: Secondary | ICD-10-CM | POA: Diagnosis not present

## 2023-08-30 DIAGNOSIS — D3132 Benign neoplasm of left choroid: Secondary | ICD-10-CM | POA: Diagnosis not present

## 2023-08-30 DIAGNOSIS — H04123 Dry eye syndrome of bilateral lacrimal glands: Secondary | ICD-10-CM | POA: Diagnosis not present

## 2023-10-18 ENCOUNTER — Ambulatory Visit: Admitting: Family Medicine

## 2023-10-18 ENCOUNTER — Other Ambulatory Visit: Payer: Self-pay

## 2023-10-18 ENCOUNTER — Encounter: Payer: Self-pay | Admitting: Family Medicine

## 2023-10-18 ENCOUNTER — Ambulatory Visit (INDEPENDENT_AMBULATORY_CARE_PROVIDER_SITE_OTHER)

## 2023-10-18 VITALS — BP 126/76 | HR 87 | Ht 66.0 in | Wt 201.0 lb

## 2023-10-18 DIAGNOSIS — M1711 Unilateral primary osteoarthritis, right knee: Secondary | ICD-10-CM

## 2023-10-18 DIAGNOSIS — M542 Cervicalgia: Secondary | ICD-10-CM

## 2023-10-18 DIAGNOSIS — M25561 Pain in right knee: Secondary | ICD-10-CM

## 2023-10-18 DIAGNOSIS — G8929 Other chronic pain: Secondary | ICD-10-CM

## 2023-10-18 NOTE — Progress Notes (Signed)
 I, Diana Browning, CMA acting as a scribe for Diana Lloyd, MD.  Diana Browning is a 62 y.o. female who presents to [facility] at [facility] today for follow up of right knee pain. Pt was last seen by Dr. Lloyd on 01/22/23, noting minimal improvement with Zilretta  injection, therefor MRI order was placed, but has not yet been performed. Considering surgical consult for Visco / Gel shot for the knee OA.   Today, patient reports not going for MRI d/t having Covid. Notes aching in the knee, worse at night, especially after over doing it. Swelling present. Has tried Tylenol , IBU, and Lido patches with short-term relief. Also prescribed pain med for unrelated issues. Mechanical sx present, feels unsteady at times. Notes having some relief after last Zilretta  injection, improvement seemed to have ben delayed.   She does note pain radiating down her left arm as well.  Pain radiates down the left arm to the lateral elbow forearm area extending a bit into the ulnar forearm.  She does have a history of cervical radiculopathy and have a history of surgery in the past.  Dx imaging: 09/17/22 R knee XR                     10/29/14 R knee MRI   Pertinent review of systems: No fevers or chills  Relevant historical information: History of neck surgery C5-C7   Exam:  BP 126/76   Pulse 87   Ht 5' 6 (1.676 m)   Wt 201 lb (91.2 kg)   SpO2 97%   BMI 32.44 kg/m  General: Well Developed, well nourished, and in no acute distress.   MSK: Right knee mild effusion normal-appearing otherwise normal motion.  Intact strength.  C-spine decreased cervical motion upper extremity strength is intact.    Lab and Radiology Results  Procedure: Real-time Ultrasound Guided Injection of right knee joint superior lateral patella space Device: Philips Affiniti 50G/GE Logiq Images permanently stored and available for review in PACS Verbal informed consent obtained.  Discussed risks and benefits of  procedure. Warned about infection, bleeding, hyperglycemia damage to structures among others. Patient expresses understanding and agreement Time-out conducted.   Noted no overlying erythema, induration, or other signs of local infection.   Skin prepped in a sterile fashion.   Local anesthesia: Topical Ethyl chloride.   With sterile technique and under real time ultrasound guidance: 40 mg of Kenalog  and 2 mL of Marcaine injected into knee joint. Fluid seen entering the joint capsule.   Completed without difficulty   Pain immediately resolved suggesting accurate placement of the medication.   Advised to call if fevers/chills, erythema, induration, drainage, or persistent bleeding.   Images permanently stored and available for review in the ultrasound unit.  Impression: Technically successful ultrasound guided injection.   X-ray images C-spine and right knee obtained today personally and independently interpreted.  C-spine: Mild degenerative changes.  AICD at C5-C7.  No acute fractures.  Right knee: Mild medial DJD.  Await formal radiology review     Assessment and Plan: 63 y.o. female with chronic right knee pain.  This is a chronic ongoing issue.  Patient was last seen for this January 2025.  She was a bit lost to follow-up due to other health obligations for Diana Browning and her family.  If that enough time that repeat x-ray and repeat trial of injection does make sense.  If not improved consider MRI.  She does not have enough severe arthritis on  x-ray today to explain the degree of pain.  Left arm pain concerning for cervical radiculopathy C7 or C8.  This would fit with her postsurgical pattern.  If not improving consider MRI C-spine or CT myelogram C-spine.   PDMP not reviewed this encounter. Orders Placed This Encounter  Procedures   US  LIMITED JOINT SPACE STRUCTURES LOW RIGHT(NO LINKED CHARGES)    Reason for Exam (SYMPTOM  OR DIAGNOSIS REQUIRED):   right knee pain    Preferred  imaging location?:   [facility] Knee AP/LAT W/Sunrise Right    Standing Status:   Future    Number of Occurrences:   1    Expiration Date:   10/17/2024    Reason for Exam (SYMPTOM  OR DIAGNOSIS REQUIRED):   eval right knee pain    Preferred imaging location?:   [facility]   DG Cervical Spine 2 or 3 views    Standing Status:   Future    Number of Occurrences:   1    Expiration Date:   10/17/2024    Reason for Exam (SYMPTOM  OR DIAGNOSIS REQUIRED):   eval pain cspine    Preferred imaging location?:   [facility]   No orders of the defined types were placed in this encounter.    Discussed warning signs or symptoms. Please see discharge instructions. Patient expresses understanding.   The above documentation has been reviewed and is accurate and complete Diana Browning, M.D.

## 2023-10-18 NOTE — Patient Instructions (Signed)
 Thank you for coming in today.   You received an injection today. Seek immediate medical attention if the joint becomes red, extremely painful, or is oozing fluid.   See you back as needed.

## 2023-10-19 ENCOUNTER — Ambulatory Visit: Payer: Self-pay | Admitting: Family Medicine

## 2023-10-19 NOTE — Progress Notes (Signed)
 Right knee x-ray shows mild arthritis.

## 2023-10-21 NOTE — Progress Notes (Signed)
 Cervical spine x-ray shows prior neck surgery with a fusion at C5-C7.  No evidence of hardware loosening.

## 2023-11-17 ENCOUNTER — Telehealth: Payer: Self-pay | Admitting: Family Medicine

## 2023-11-17 DIAGNOSIS — M542 Cervicalgia: Secondary | ICD-10-CM

## 2023-11-17 NOTE — Telephone Encounter (Signed)
 Patient called asking if Dr Joane would be able to refer her to Dr Jama Pereyra at [facility]  for her neck and shoulder?  Address: [address] [place], [place] [address] Phone Number: [phone] Referral Fax: [phone]

## 2023-11-18 ENCOUNTER — Telehealth: Payer: Self-pay | Admitting: Family Medicine

## 2023-11-18 DIAGNOSIS — G8929 Other chronic pain: Secondary | ICD-10-CM

## 2023-11-18 NOTE — Telephone Encounter (Signed)
 Patient called asking if the MRI of her right knee could be re-ordered.  (She said the original one as elapsed?) She also asked if it could be sent to some where in [place] so it is closer for her?  Please advise.

## 2023-12-13 ENCOUNTER — Other Ambulatory Visit

## 2023-12-31 ENCOUNTER — Encounter: Payer: Self-pay | Admitting: Family Medicine

## 2024-01-05 ENCOUNTER — Other Ambulatory Visit

## 2024-01-22 ENCOUNTER — Ambulatory Visit
Admission: RE | Admit: 2024-01-22 | Discharge: 2024-01-22 | Disposition: A | Source: Ambulatory Visit | Attending: Family Medicine | Admitting: Family Medicine

## 2024-01-22 DIAGNOSIS — G8929 Other chronic pain: Secondary | ICD-10-CM

## 2024-01-24 ENCOUNTER — Ambulatory Visit: Payer: Self-pay | Admitting: Family Medicine

## 2024-01-24 NOTE — Progress Notes (Signed)
 Right knee MRI shows mild arthritis with a little bit of swelling and a small Baker's cyst.  Recommend return to clinic to go over the results in full detail and discuss treatment plan and options from here.

## 2024-02-01 ENCOUNTER — Ambulatory Visit: Admitting: Family Medicine

## 2024-02-16 ENCOUNTER — Ambulatory Visit: Admitting: Family Medicine
